# Patient Record
Sex: Female | Born: 1935 | Race: White | Hispanic: No | Marital: Single | Smoking: Never smoker
Health system: Southern US, Community
[De-identification: ages and names within clinical notes are randomized; demographics above are authoritative.]

## PROBLEM LIST (undated history)

## (undated) DIAGNOSIS — I441 Atrioventricular block, second degree: Secondary | ICD-10-CM

## (undated) DIAGNOSIS — I447 Left bundle-branch block, unspecified: Secondary | ICD-10-CM

## (undated) DIAGNOSIS — G47419 Narcolepsy without cataplexy: Secondary | ICD-10-CM

## (undated) DIAGNOSIS — C519 Malignant neoplasm of vulva, unspecified: Secondary | ICD-10-CM

## (undated) HISTORY — PX: TONSILLECTOMY: SUR1361

## (undated) HISTORY — PX: HERNIA REPAIR: SHX51

## (undated) HISTORY — PX: MASTOIDECTOMY REVISION: SUR856

## (undated) HISTORY — PX: ABDOMINAL SURGERY: SHX537

## (undated) HISTORY — PX: BOWEL RESECTION: SHX1257

## (undated) HISTORY — PX: WRIST FRACTURE SURGERY: SHX121

## (undated) HISTORY — PX: ABDOMINAL HYSTERECTOMY: SHX81

---

## 2009-09-02 ENCOUNTER — Emergency Department (HOSPITAL_COMMUNITY): Admission: EM | Admit: 2009-09-02 | Discharge: 2009-09-03 | Payer: Self-pay | Admitting: Emergency Medicine

## 2010-05-15 ENCOUNTER — Other Ambulatory Visit: Payer: Self-pay | Admitting: Family Medicine

## 2010-05-28 ENCOUNTER — Other Ambulatory Visit: Payer: Self-pay

## 2010-05-29 ENCOUNTER — Ambulatory Visit
Admission: RE | Admit: 2010-05-29 | Discharge: 2010-05-29 | Disposition: A | Discharge status: Home / Self Care | Payer: Medicare Other | Source: Ambulatory Visit | Attending: Family Medicine | Admitting: Family Medicine

## 2011-07-02 DIAGNOSIS — C519 Malignant neoplasm of vulva, unspecified: Secondary | ICD-10-CM | POA: Diagnosis not present

## 2012-01-21 DIAGNOSIS — N8111 Cystocele, midline: Secondary | ICD-10-CM | POA: Diagnosis not present

## 2012-01-21 DIAGNOSIS — C519 Malignant neoplasm of vulva, unspecified: Secondary | ICD-10-CM | POA: Diagnosis not present

## 2012-03-10 ENCOUNTER — Emergency Department (HOSPITAL_COMMUNITY): Payer: Medicare Other

## 2012-03-10 ENCOUNTER — Encounter (HOSPITAL_COMMUNITY): Payer: Self-pay | Admitting: *Deleted

## 2012-03-10 ENCOUNTER — Inpatient Hospital Stay (HOSPITAL_COMMUNITY): Payer: Medicare Other

## 2012-03-10 ENCOUNTER — Inpatient Hospital Stay (HOSPITAL_COMMUNITY)
Admission: EM | Admit: 2012-03-10 | Discharge: 2012-03-22 | DRG: 242 | Disposition: A | Discharge status: Home / Self Care | Payer: Medicare Other | Attending: Interventional Cardiology | Admitting: Interventional Cardiology

## 2012-03-10 DIAGNOSIS — D649 Anemia, unspecified: Secondary | ICD-10-CM | POA: Diagnosis present

## 2012-03-10 DIAGNOSIS — I4430 Unspecified atrioventricular block: Secondary | ICD-10-CM

## 2012-03-10 DIAGNOSIS — G47419 Narcolepsy without cataplexy: Secondary | ICD-10-CM | POA: Diagnosis present

## 2012-03-10 DIAGNOSIS — K5900 Constipation, unspecified: Secondary | ICD-10-CM | POA: Diagnosis present

## 2012-03-10 DIAGNOSIS — R42 Dizziness and giddiness: Secondary | ICD-10-CM | POA: Diagnosis not present

## 2012-03-10 DIAGNOSIS — J95811 Postprocedural pneumothorax: Secondary | ICD-10-CM | POA: Diagnosis not present

## 2012-03-10 DIAGNOSIS — J939 Pneumothorax, unspecified: Secondary | ICD-10-CM | POA: Diagnosis not present

## 2012-03-10 DIAGNOSIS — J438 Other emphysema: Secondary | ICD-10-CM | POA: Diagnosis not present

## 2012-03-10 DIAGNOSIS — G4700 Insomnia, unspecified: Secondary | ICD-10-CM | POA: Diagnosis not present

## 2012-03-10 DIAGNOSIS — Z950 Presence of cardiac pacemaker: Secondary | ICD-10-CM | POA: Diagnosis not present

## 2012-03-10 DIAGNOSIS — D72829 Elevated white blood cell count, unspecified: Secondary | ICD-10-CM | POA: Diagnosis not present

## 2012-03-10 DIAGNOSIS — Z45018 Encounter for adjustment and management of other part of cardiac pacemaker: Secondary | ICD-10-CM | POA: Diagnosis not present

## 2012-03-10 DIAGNOSIS — J449 Chronic obstructive pulmonary disease, unspecified: Secondary | ICD-10-CM | POA: Diagnosis not present

## 2012-03-10 DIAGNOSIS — J9383 Other pneumothorax: Secondary | ICD-10-CM | POA: Diagnosis not present

## 2012-03-10 DIAGNOSIS — I5033 Acute on chronic diastolic (congestive) heart failure: Secondary | ICD-10-CM | POA: Diagnosis present

## 2012-03-10 DIAGNOSIS — F4321 Adjustment disorder with depressed mood: Secondary | ICD-10-CM | POA: Diagnosis not present

## 2012-03-10 DIAGNOSIS — I441 Atrioventricular block, second degree: Secondary | ICD-10-CM | POA: Diagnosis not present

## 2012-03-10 DIAGNOSIS — J9600 Acute respiratory failure, unspecified whether with hypoxia or hypercapnia: Secondary | ICD-10-CM | POA: Diagnosis not present

## 2012-03-10 DIAGNOSIS — J90 Pleural effusion, not elsewhere classified: Secondary | ICD-10-CM | POA: Diagnosis not present

## 2012-03-10 DIAGNOSIS — J9819 Other pulmonary collapse: Secondary | ICD-10-CM | POA: Diagnosis not present

## 2012-03-10 DIAGNOSIS — E871 Hypo-osmolality and hyponatremia: Secondary | ICD-10-CM | POA: Diagnosis not present

## 2012-03-10 DIAGNOSIS — H539 Unspecified visual disturbance: Secondary | ICD-10-CM | POA: Diagnosis not present

## 2012-03-10 DIAGNOSIS — I509 Heart failure, unspecified: Secondary | ICD-10-CM | POA: Diagnosis not present

## 2012-03-10 DIAGNOSIS — J189 Pneumonia, unspecified organism: Secondary | ICD-10-CM | POA: Diagnosis not present

## 2012-03-10 DIAGNOSIS — J930 Spontaneous tension pneumothorax: Secondary | ICD-10-CM | POA: Diagnosis not present

## 2012-03-10 DIAGNOSIS — Z4682 Encounter for fitting and adjustment of non-vascular catheter: Secondary | ICD-10-CM | POA: Diagnosis not present

## 2012-03-10 DIAGNOSIS — R079 Chest pain, unspecified: Secondary | ICD-10-CM | POA: Diagnosis not present

## 2012-03-10 DIAGNOSIS — Y831 Surgical operation with implant of artificial internal device as the cause of abnormal reaction of the patient, or of later complication, without mention of misadventure at the time of the procedure: Secondary | ICD-10-CM | POA: Diagnosis not present

## 2012-03-10 DIAGNOSIS — Y9210 Unspecified residential institution as the place of occurrence of the external cause: Secondary | ICD-10-CM | POA: Diagnosis not present

## 2012-03-10 DIAGNOSIS — I459 Conduction disorder, unspecified: Secondary | ICD-10-CM | POA: Diagnosis not present

## 2012-03-10 HISTORY — DX: Left bundle-branch block, unspecified: I44.7

## 2012-03-10 HISTORY — DX: Atrioventricular block, second degree: I44.1

## 2012-03-10 HISTORY — DX: Malignant neoplasm of vulva, unspecified: C51.9

## 2012-03-10 HISTORY — DX: Narcolepsy without cataplexy: G47.419

## 2012-03-10 LAB — COMPREHENSIVE METABOLIC PANEL
ALT: 10 U/L (ref 0–35)
AST: 17 U/L (ref 0–37)
Albumin: 3.8 g/dL (ref 3.5–5.2)
Alkaline Phosphatase: 97 U/L (ref 39–117)
CO2: 26 mEq/L (ref 19–32)
Calcium: 9.3 mg/dL (ref 8.4–10.5)
Chloride: 102 mEq/L (ref 96–112)
Creatinine, Ser: 0.7 mg/dL (ref 0.50–1.10)
Creatinine, Ser: 0.71 mg/dL (ref 0.50–1.10)
GFR calc non Af Amer: 79 mL/min — ABNORMAL LOW (ref >90)
GFR calc non Af Amer: 79 mL/min — ABNORMAL LOW (ref >90)
Glucose, Bld: 90 mg/dL (ref 70–99)
Potassium: 3.9 mEq/L (ref 3.5–5.1)
Total Bilirubin: 0.3 mg/dL (ref 0.3–1.2)
Total Bilirubin: 0.3 mg/dL (ref 0.3–1.2)
Total Protein: 7 g/dL (ref 6.0–8.3)

## 2012-03-10 LAB — URINALYSIS, ROUTINE W REFLEX MICROSCOPIC
Glucose, UA: NEGATIVE mg/dL
Hgb urine dipstick: NEGATIVE
Leukocytes, UA: NEGATIVE
Nitrite: NEGATIVE
Protein, ur: NEGATIVE mg/dL
Specific Gravity, Urine: 1.006 (ref 1.005–1.030)
Urobilinogen, UA: 0.2 mg/dL (ref 0.0–1.0)

## 2012-03-10 LAB — CBC WITH DIFFERENTIAL/PLATELET
Basophils Absolute: 0 10*3/uL (ref 0.0–0.1)
Basophils Relative: 0 % (ref 0–1)
Eosinophils Absolute: 0.1 10*3/uL (ref 0.0–0.7)
Eosinophils Relative: 1 % (ref 0–5)
HCT: 38.6 % (ref 36.0–46.0)
Lymphocytes Relative: 25 % (ref 12–46)
MCH: 30.1 pg (ref 26.0–34.0)
MCV: 88.7 fL (ref 78.0–100.0)
Monocytes Relative: 8 % (ref 3–12)
Platelets: 283 10*3/uL (ref 150–400)
RBC: 4.35 MIL/uL (ref 3.87–5.11)

## 2012-03-10 LAB — PHOSPHORUS: Phosphorus: 3.1 mg/dL (ref 2.3–4.6)

## 2012-03-10 MED ORDER — ENOXAPARIN SODIUM 40 MG/0.4ML ~~LOC~~ SOLN
40.0000 mg | SUBCUTANEOUS | Status: DC
  Administered 2012-03-10: 40 mg via SUBCUTANEOUS
  Filled 2012-03-10 (×3): qty 0.4

## 2012-03-10 MED ORDER — ACETAMINOPHEN 325 MG PO TABS
650.0000 mg | ORAL_TABLET | Freq: Four times a day (QID) | ORAL | Status: DC | PRN
  Administered 2012-03-11 – 2012-03-17 (×6): 650 mg via ORAL
  Administered 2012-03-17: 325 mg via ORAL
  Administered 2012-03-18 – 2012-03-21 (×8): 650 mg via ORAL
  Administered 2012-03-22: 325 mg via ORAL
  Filled 2012-03-10: qty 2
  Filled 2012-03-10: qty 1
  Filled 2012-03-10 (×12): qty 2
  Filled 2012-03-10: qty 1
  Filled 2012-03-10: qty 2

## 2012-03-10 MED ORDER — ZOLPIDEM TARTRATE 5 MG PO TABS: 5.0000 mg | ORAL_TABLET | Freq: Every evening | ORAL | Status: DC | PRN

## 2012-03-10 MED ORDER — OXYCODONE HCL 5 MG PO TABS
5.0000 mg | ORAL_TABLET | ORAL | Status: DC | PRN
  Administered 2012-03-12: 5 mg via ORAL
  Filled 2012-03-10: qty 1

## 2012-03-10 MED ORDER — ACETAMINOPHEN 650 MG RE SUPP: 650.0000 mg | Freq: Four times a day (QID) | RECTAL | Status: DC | PRN

## 2012-03-10 MED ORDER — ASPIRIN EC 81 MG PO TBEC
81.0000 mg | DELAYED_RELEASE_TABLET | Freq: Every day | ORAL | Status: DC
  Administered 2012-03-10 – 2012-03-22 (×13): 81 mg via ORAL
  Filled 2012-03-10 (×14): qty 1

## 2012-03-10 MED ORDER — DOCUSATE SODIUM 100 MG PO CAPS
100.0000 mg | ORAL_CAPSULE | Freq: Two times a day (BID) | ORAL | Status: DC
  Administered 2012-03-10 – 2012-03-22 (×23): 100 mg via ORAL
  Filled 2012-03-10 (×26): qty 1

## 2012-03-10 NOTE — H&P (Signed)
Robin [name] is a 79 y.o. female  Admit date: 03/10/2012 Referring Physician: [facility] Primary Cardiologist:: Barry Dienes.B.Katrinka Blazing, III Chief complaint / reason for admission: Weakness  HPI: The patient came to the ER after Dr. Marlynn Perking office directed her to do so this AM. She called the office hoping to see her primary but was directed to ER after complaining of fatigue, palpitations, weakness, dizziness, and a "weird feeling " intermittently over the past 3 weeks. She denies chest pain and dyspnea. In the ER was noted to have AV block at least Mobitz type 1 and on occasion high grade AV block.  Prior history of rate related LBBB prior to Gyn surgery last year. Echocardiogram and cardiac consult at [facility] were unremarkable.    PMH:    Past Medical History  Diagnosis Date  . LBBB (left bundle branch block)   . Narcolepsy   . Vulva cancer     PSH:    Past Surgical History  Procedure Date  . Wrist fracture surgery   . Hernia repair   . Mastoidectomy revision   . Tonsillectomy   . Abdominal surgery     exploratory  . Abdominal hysterectomy   . Bowel resection     ALLERGIES:   Influenza a (h1n1) monoval pf and Penicillins  Prior to Admit Meds:   (Not in a hospital admission) Family HX:    Family History  Problem Relation Age of Onset  . Heart attack Mother   . Cancer Father    Social HX:    History   Social History  . Marital Status: Single    Spouse Name: N/A    Number of Children: N/A  . Years of Education: N/A   Occupational History  . Not on file.   Social History Main Topics  . Smoking status: Never Smoker   . Smokeless tobacco: Never Used  . Alcohol Use: Yes     Comment: seldom  . Drug Use: No  . Sexually Active: No   Other Topics Concern  . Not on file   Social History Narrative  . No narrative on file     ROS: Usually very active. No history of heart disease, seizure disorder, stroke, bleeding, DM, Htn, or other  chronic medical conditions. She denies recent upper respiratory illness, significant headache, chest pain, abdominal pain, and transient neurological symptoms.  Physical Exam: Blood pressure 188/58, pulse 49, temperature 98.5 F (36.9 C), temperature source Oral, resp. rate 19, SpO2 96.00%.   The patient is in no acute distress in her skin color is normal  The neck exam reveals no obvious JVD and carotid bruits or not heard.  Lungs reveal basilar rales.  Cardiac exam reveals an irregular rhythm. No significant murmur is heard. No rub is heard.  Abdomen soft. Bowel sounds are normal.  Upper and lower extremities reveal no edema. Radial pulses are 2+ and posterior tibial pulses are 2+.  Neurological exam is grossly intact. Labs:   Lab Results  Component Value Date   WBC 7.4 03/10/2012   HGB 13.1 03/10/2012   HCT 38.6 03/10/2012   MCV 88.7 03/10/2012   PLT 283 03/10/2012    Lab 03/10/12 1215  NA 141  K 4.3  CL 105  CO2 24  BUN 12  CREATININE 0.70  CALCIUM 9.3  PROT 7.0  BILITOT 0.3  ALKPHOS 98  ALT 12  AST 21  GLUCOSE 90     Radiology: no data  EKG:  2nd and ? 3rd degree AV block with PVC's  ASSESSMENT:  1. High grade AV block, mildly symptomatic  2. Narcolepsy  3. History of Vulvar cancer  Plan:  1. Admit for monitoring and further evaluation  2. EP consult for possible DDD pacer.  3. Check TSH and markers.  4. Echocardiogram  Lesleigh Noe 03/10/2012 4:37 PM

## 2012-03-10 NOTE — ED Notes (Signed)
I walked the patient to the bathroom and she stated it felt like she was drunk.

## 2012-03-10 NOTE — ED Provider Notes (Signed)
History     CSN: [id number]  Arrival date & time 03/10/12  1039   First MD Initiated Contact with Patient 03/10/12 1146      Chief Complaint  Patient presents with  . Dizziness    (Consider location/radiation/quality/duration/timing/severity/associated sxs/prior treatment) HPI Comments: Patient presents with a one-week history of dizziness. She did not have a sensation of spinning however she does feel off balance when she's walking. She has slight throbbing headache to her posterior scalp area. She denies any vision or speech difficulties. She denies any numbness or weakness in her extremities other than at times she feels tingling all over. She has some occasional feelings of throbbing and pulsating in her heart. This is not specifically associated with the dizziness. She denies any shortness of breath. She denies any history of heart problems in the past other than left bundle branch block. She denies any recent head trauma. She denies any fevers cough congestion or recent illnesses.   Past Medical History  Diagnosis Date  . LBBB (left bundle branch block)   . Narcolepsy   . Vulva cancer     Past Surgical History  Procedure Date  . Wrist fracture surgery   . Hernia repair   . Mastoidectomy revision   . Tonsillectomy   . Abdominal surgery     exploratory  . Abdominal hysterectomy   . Bowel resection     No family history on file.  History  Substance Use Topics  . Smoking status: Never Smoker   . Smokeless tobacco: Not on file  . Alcohol Use: Yes     Comment: seldom    OB History    Grav Para Term Preterm Abortions TAB SAB Ect Mult Living                  Review of Systems  Constitutional: Negative for fever, chills, diaphoresis and fatigue.  HENT: Negative for congestion, rhinorrhea and sneezing.   Eyes: Negative.   Respiratory: Negative for cough, chest tightness and shortness of breath.   Cardiovascular: Positive for palpitations. Negative for chest pain  and leg swelling.  Gastrointestinal: Negative for nausea, vomiting, abdominal pain, diarrhea and blood in stool.  Genitourinary: Negative for frequency, hematuria, flank pain and difficulty urinating.  Musculoskeletal: Negative for back pain and arthralgias.  Skin: Negative for rash.  Neurological: Positive for dizziness. Negative for syncope, speech difficulty, weakness, numbness and headaches.    Allergies  Influenza a (h1n1) monoval pf and Penicillins  Home Medications   Current Outpatient Rx  Name  Route  Sig  Dispense  Refill  . DOCUSATE SODIUM 100 MG PO CAPS   Oral   Take 100 mg by mouth daily as needed. constipation         . ECHINACEA 125 MG PO CAPS   Oral   Take 1 capsule by mouth daily.           BP 188/58  Pulse 49  Temp 98.5 F (36.9 C) (Oral)  Resp 19  SpO2 96%  Physical Exam  Constitutional: She is oriented to person, place, and time. She appears well-developed and well-nourished.  HENT:  Head: Normocephalic and atraumatic.  Eyes: Pupils are equal, round, and reactive to light.       No nystagmus  Neck: Normal range of motion. Neck supple.  Cardiovascular: Normal rate, regular rhythm and normal heart sounds.   Pulmonary/Chest: Effort normal and breath sounds normal. No respiratory distress. She has no wheezes. She has no rales. She  exhibits no tenderness.  Abdominal: Soft. Bowel sounds are normal. There is no tenderness. There is no rebound and no guarding.  Musculoskeletal: Normal range of motion. She exhibits no edema.  Lymphadenopathy:    She has no cervical adenopathy.  Neurological: She is alert and oriented to person, place, and time. She has normal strength. No cranial nerve deficit or sensory deficit. GCS eye subscore is 4. GCS verbal subscore is 5. GCS motor subscore is 6.       Finger to nose intact  Skin: Skin is warm and dry. No rash noted.  Psychiatric: She has a normal mood and affect.    ED Course  Procedures (including critical  care time)  Results for orders placed during the hospital encounter of 03/10/12  CBC WITH DIFFERENTIAL      Component Value Range   WBC 7.4  4.0 - 10.5 K/uL   RBC 4.35  3.87 - 5.11 MIL/uL   Hemoglobin 13.1  12.0 - 15.0 g/dL   HCT 62.1  30.8 - 65.7 %   MCV 88.7  78.0 - 100.0 fL   MCH 30.1  26.0 - 34.0 pg   MCHC 33.9  30.0 - 36.0 g/dL   RDW 84.6  96.2 - 95.2 %   Platelets 283  150 - 400 K/uL   Neutrophils Relative 66  43 - 77 %   Neutro Abs 4.9  1.7 - 7.7 K/uL   Lymphocytes Relative 25  12 - 46 %   Lymphs Abs 1.9  0.7 - 4.0 K/uL   Monocytes Relative 8  3 - 12 %   Monocytes Absolute 0.6  0.1 - 1.0 K/uL   Eosinophils Relative 1  0 - 5 %   Eosinophils Absolute 0.1  0.0 - 0.7 K/uL   Basophils Relative 0  0 - 1 %   Basophils Absolute 0.0  0.0 - 0.1 K/uL  COMPREHENSIVE METABOLIC PANEL      Component Value Range   Sodium 141  135 - 145 mEq/L   Potassium 4.3  3.5 - 5.1 mEq/L   Chloride 105  96 - 112 mEq/L   CO2 24  19 - 32 mEq/L   Glucose, Bld 90  70 - 99 mg/dL   BUN 12  6 - 23 mg/dL   Creatinine, Ser 8.41  0.50 - 1.10 mg/dL   Calcium 9.3  8.4 - 32.4 mg/dL   Total Protein 7.0  6.0 - 8.3 g/dL   Albumin 3.9  3.5 - 5.2 g/dL   AST 21  0 - 37 U/L   ALT 12  0 - 35 U/L   Alkaline Phosphatase 98  39 - 117 U/L   Total Bilirubin 0.3  0.3 - 1.2 mg/dL   GFR calc non Af Amer 79 (*) >90 mL/min   GFR calc Af Amer >90  >90 mL/min  URINALYSIS, ROUTINE W REFLEX MICROSCOPIC      Component Value Range   Color, Urine YELLOW  YELLOW   APPearance CLEAR  CLEAR   Specific Gravity, Urine 1.006  1.005 - 1.030   pH 7.0  5.0 - 8.0   Glucose, UA NEGATIVE  NEGATIVE mg/dL   Hgb urine dipstick NEGATIVE  NEGATIVE   Bilirubin Urine NEGATIVE  NEGATIVE   Ketones, ur NEGATIVE  NEGATIVE mg/dL   Protein, ur NEGATIVE  NEGATIVE mg/dL   Urobilinogen, UA 0.2  0.0 - 1.0 mg/dL   Nitrite NEGATIVE  NEGATIVE   Leukocytes, UA NEGATIVE  NEGATIVE   Mr Brain 62  Contrast  03/10/2012  *RADIOLOGY REPORT*  Clinical Data:  Dizziness.  Rule out stroke  MRI HEAD WITHOUT CONTRAST  Technique:  Multiplanar, multiecho pulse sequences of the brain and surrounding structures were obtained according to standard protocol without intravenous contrast.  Comparison: None  Findings: Negative for acute infarct.  Mild chronic microvascular ischemic change in the cerebral white matter and pons.  Cerebellum is intact.  Negative for intracranial hemorrhage.  No mass or edema.  Paranasal sinuses are clear.  IMPRESSION: No acute abnormality.  Mild chronic microvascular ischemia.   Original Report Authenticated By: Janeece Riggers, M.D.       Date: 03/10/2012  Rate: 71  Rhythm: normal sinus rhythm and premature ventricular contractions (PVC)  QRS Axis: left  Intervals: normal  ST/T Wave abnormalities: nonspecific ST/T changes  Conduction Disutrbances:right bundle branch block and left anterior fascicular block  Narrative Interpretation:   Old EKG Reviewed: none available   Date: 03/10/2012  Rate: 72  Rhythm: Mobitz type 2 block with bradycardia, bigimeny  QRS Axis: left  Intervals: normal  ST/T Wave abnormalities: nonspecific ST/T changes  Conduction Disutrbances:left anterior fascicular block, RBBB  Narrative Interpretation:   Old EKG Reviewed: changes noted    1. Dizziness   2. Mobitz type 2 second degree heart block       MDM  Repeat EKG shows 2nd degree type II heart block with bigeminy.  No signs of stroke.  Pt is having intermittent bradycardia down to 30s, but is maintaining BP.  I consulted cardiology.  Dr Katrinka Blazing is coming to see pt.        Rolan Bucco, MD 03/10/12 [phone]

## 2012-03-10 NOTE — ED Notes (Signed)
[facility] called for transport. 

## 2012-03-10 NOTE — ED Notes (Signed)
Pt reports "feeling weird almost like drunk" for over a week. Pt sts called [facility] office this am (last time she was seen by dr was 2 years ago) and was told to go to ed immediately "because like my daughter they thought I am having a stroke). Pt is very vague with describing her symptoms and has multiple complaints. Neuro check was negative, pt denies pain.

## 2012-03-10 NOTE — ED Notes (Signed)
Got the patient into a gown and placed her belongings into a patient bag. She is sitting with her neighbor.

## 2012-03-10 NOTE — ED Notes (Signed)
Pt noted on monitor to be brady; heart rate low as 39 few times. Dr Fredderick Phenix notified. Pt is asymptomatic, b/p 185/54. Will continue to monitor.

## 2012-03-10 NOTE — ED Notes (Signed)
[id number]> Expected date:<BR> Expected time:<BR> Means of arrival:<BR> Comments:<BR> Hold for [facility]

## 2012-03-10 NOTE — ED Notes (Signed)
Pt reports multiple complaints. Has list written down of complaints, "feel heart beating in fingers/ toes, dizzy, head feels weird, shaky, equilibrium off balance, pain in back and neck, pressure in temples, feels like I'm drunk, lips feel puffy." No swelling noted to lips, no slurred speech, stroke screen neg.

## 2012-03-11 ENCOUNTER — Inpatient Hospital Stay (HOSPITAL_COMMUNITY): Payer: Medicare Other

## 2012-03-11 ENCOUNTER — Encounter (HOSPITAL_COMMUNITY): Payer: Self-pay | Admitting: Internal Medicine

## 2012-03-11 ENCOUNTER — Encounter (HOSPITAL_COMMUNITY)
Admission: EM | Disposition: A | Discharge status: Home / Self Care | Payer: Self-pay | Source: Home / Self Care | Attending: Interventional Cardiology

## 2012-03-11 DIAGNOSIS — I4430 Unspecified atrioventricular block: Secondary | ICD-10-CM

## 2012-03-11 DIAGNOSIS — Z950 Presence of cardiac pacemaker: Secondary | ICD-10-CM | POA: Diagnosis not present

## 2012-03-11 DIAGNOSIS — I5033 Acute on chronic diastolic (congestive) heart failure: Secondary | ICD-10-CM | POA: Diagnosis present

## 2012-03-11 DIAGNOSIS — J939 Pneumothorax, unspecified: Secondary | ICD-10-CM | POA: Diagnosis not present

## 2012-03-11 DIAGNOSIS — J9383 Other pneumothorax: Secondary | ICD-10-CM

## 2012-03-11 DIAGNOSIS — I441 Atrioventricular block, second degree: Principal | ICD-10-CM

## 2012-03-11 HISTORY — PX: PERMANENT PACEMAKER INSERTION: SHX5480

## 2012-03-11 LAB — POCT I-STAT 3, ART BLOOD GAS (G3+)
O2 Saturation: 93 %
Patient temperature: 98.6
TCO2: 25 mmol/L (ref 0–100)
pCO2 arterial: 41.2 mmHg (ref 35.0–45.0)

## 2012-03-11 LAB — PROTIME-INR: INR: 1.02 (ref 0.00–1.49)

## 2012-03-11 SURGERY — PERMANENT PACEMAKER INSERTION
Anesthesia: LOCAL

## 2012-03-11 MED ORDER — HEPARIN (PORCINE) IN NACL 2-0.9 UNIT/ML-% IJ SOLN
INTRAMUSCULAR | Status: AC
  Filled 2012-03-11: qty 500

## 2012-03-11 MED ORDER — GENTAMICIN SULFATE 40 MG/ML IJ SOLN
80.0000 mg | INTRAMUSCULAR | Status: AC
  Administered 2012-03-11: 80 mg
  Filled 2012-03-11 (×2): qty 2

## 2012-03-11 MED ORDER — OFF THE BEAT BOOK
Freq: Once | Status: AC
  Administered 2012-03-11: 14:00:00
  Filled 2012-03-11: qty 1

## 2012-03-11 MED ORDER — SODIUM CHLORIDE 0.45 % IV SOLN
INTRAVENOUS | Status: DC
  Administered 2012-03-11: 13:00:00 via INTRAVENOUS

## 2012-03-11 MED ORDER — VANCOMYCIN HCL IN DEXTROSE 1-5 GM/200ML-% IV SOLN
1000.0000 mg | INTRAVENOUS | Status: AC
  Administered 2012-03-11: 1000 mg via INTRAVENOUS
  Filled 2012-03-11: qty 200

## 2012-03-11 MED ORDER — SODIUM CHLORIDE 0.9 % IV SOLN
250.0000 mL | INTRAVENOUS | Status: DC
  Administered 2012-03-11: 13:00:00 via INTRAVENOUS

## 2012-03-11 MED ORDER — MIDAZOLAM HCL 5 MG/5ML IJ SOLN
INTRAMUSCULAR | Status: AC
  Filled 2012-03-11: qty 5

## 2012-03-11 MED ORDER — ONDANSETRON HCL 4 MG/2ML IJ SOLN
4.0000 mg | Freq: Four times a day (QID) | INTRAMUSCULAR | Status: DC | PRN
  Administered 2012-03-11: 4 mg via INTRAVENOUS
  Filled 2012-03-11 (×2): qty 2

## 2012-03-11 MED ORDER — OXYCODONE-ACETAMINOPHEN 5-325 MG PO TABS
1.0000 | ORAL_TABLET | ORAL | Status: DC | PRN
  Administered 2012-03-11 – 2012-03-13 (×5): 1 via ORAL
  Filled 2012-03-11 (×5): qty 1

## 2012-03-11 MED ORDER — MIDAZOLAM HCL 5 MG/5ML IJ SOLN
INTRAMUSCULAR | Status: AC
  Filled 2012-03-11: qty 10

## 2012-03-11 MED ORDER — CHLORHEXIDINE GLUCONATE 4 % EX LIQD
60.0000 mL | Freq: Once | CUTANEOUS | Status: AC
  Administered 2012-03-11: 4 via TOPICAL
  Filled 2012-03-11: qty 15

## 2012-03-11 MED ORDER — LIDOCAINE HCL (PF) 1 % IJ SOLN
INTRAMUSCULAR | Status: AC
  Filled 2012-03-11: qty 30

## 2012-03-11 MED ORDER — MIDAZOLAM HCL 2 MG/2ML IJ SOLN
INTRAMUSCULAR | Status: AC
  Filled 2012-03-11: qty 2

## 2012-03-11 MED ORDER — FENTANYL CITRATE 0.05 MG/ML IJ SOLN
INTRAMUSCULAR | Status: AC
  Filled 2012-03-11: qty 2

## 2012-03-11 MED ORDER — ACETAMINOPHEN 325 MG PO TABS: 325.0000 mg | ORAL_TABLET | ORAL | Status: DC | PRN

## 2012-03-11 MED ORDER — FLUMAZENIL 0.5 MG/5ML IV SOLN: 0.2000 mg | Freq: Once | INTRAVENOUS | Status: DC

## 2012-03-11 MED ORDER — FENTANYL CITRATE 0.05 MG/ML IJ SOLN
12.5000 ug | INTRAMUSCULAR | Status: DC | PRN
  Administered 2012-03-11 – 2012-03-15 (×8): 12.5 ug via INTRAVENOUS
  Filled 2012-03-11 (×9): qty 2

## 2012-03-11 MED ORDER — FLUMAZENIL 0.5 MG/5ML IV SOLN
INTRAVENOUS | Status: AC
  Filled 2012-03-11: qty 5

## 2012-03-11 MED ORDER — FENTANYL CITRATE 0.05 MG/ML IJ SOLN
INTRAMUSCULAR | Status: AC
  Filled 2012-03-11: qty 4

## 2012-03-11 MED ORDER — SODIUM CHLORIDE 0.9 % IR SOLN
Status: DC
  Filled 2012-03-11: qty 2

## 2012-03-11 MED ORDER — SODIUM CHLORIDE 0.9 % IJ SOLN: 3.0000 mL | INTRAMUSCULAR | Status: DC | PRN

## 2012-03-11 MED ORDER — CHLORHEXIDINE GLUCONATE 4 % EX LIQD: 60.0000 mL | Freq: Once | CUTANEOUS | Status: DC

## 2012-03-11 MED ORDER — SODIUM CHLORIDE 0.9 % IV SOLN
INTRAVENOUS | Status: AC
  Administered 2012-03-11: 19:00:00 via INTRAVENOUS

## 2012-03-11 MED ORDER — LIDOCAINE HCL (PF) 1 % IJ SOLN
INTRAMUSCULAR | Status: AC
  Filled 2012-03-11: qty 60

## 2012-03-11 MED ORDER — FENTANYL CITRATE 0.05 MG/ML IJ SOLN
INTRAMUSCULAR | Status: AC
  Administered 2012-03-11: 12.5 ug via INTRAVENOUS
  Filled 2012-03-11: qty 2

## 2012-03-11 MED ORDER — VANCOMYCIN HCL IN DEXTROSE 1-5 GM/200ML-% IV SOLN
1000.0000 mg | Freq: Two times a day (BID) | INTRAVENOUS | Status: AC
  Administered 2012-03-12: 1000 mg via INTRAVENOUS
  Filled 2012-03-11: qty 200

## 2012-03-11 MED ORDER — SODIUM CHLORIDE 0.9 % IJ SOLN
3.0000 mL | Freq: Two times a day (BID) | INTRAMUSCULAR | Status: DC
  Administered 2012-03-11: 3 mL via INTRAVENOUS

## 2012-03-11 NOTE — Consult Note (Signed)
ELECTROPHYSIOLOGY CONSULT NOTE    Patient ID: Robin [name] MRN: [id number], DOB/AGE: Jan 20, 1929 79 y.o.  Admit date: 03/10/2012 Date of Consult: 03-12-2011  Primary Physician: Sheryle Hail) Primary Cardiologist: Garnette Scheuermann, MD  Reason for Consultation: 2:1 heart block  HPI:  Robin [name] is an 79 year old female whom we have been asked to see for heart block.  Her past medical history is significant for arthritis, hernia repair, and vulva cancer (s/p surgery in December of last year).  She takes no home medications aside from Echinacea and Aleve prn. Cardiac workup done at [facility] prior to surgery last year was reportedly unremarkable and included an echocardiogram.  There is also reports of LBBB in the past.   Over the last week, she has noticed "something was not quite right".  She reports fatigue, dizziness, weakness.  She reports falling occasionally, but denies frank syncope.  She was evaluated at [facility] ER yesterday and found to have AV block with RBBB.  She was then transferred to [facility] for further evaluation.  Lab data has been unremarkable.  TSH and echo are still pending.   Robin [name] lives independently and enjoys gardening.  She is a retired Engineer, civil (consulting).  She moved to [place] from [place] to be closer to 3 of her 4 children who live here.  Prior to last year, she only rarely visited the doctor.    She has allergies to the flu shot and also PCN.   ROS is negative except as outlined above.    Past Medical History  Diagnosis Date  . LBBB (left bundle branch block)   . Narcolepsy   . Vulva cancer      Surgical History:  Past Surgical History  Procedure Date  . Wrist fracture surgery   . Hernia repair   . Mastoidectomy revision   . Tonsillectomy   . Abdominal surgery     exploratory  . Abdominal hysterectomy   . Bowel resection      Prescriptions prior to admission  Medication Sig Dispense Refill  . docusate sodium (COLACE) 100 MG capsule Take 100 mg by mouth  daily as needed. constipation      . Echinacea 125 MG CAPS Take 1 capsule by mouth daily.        Inpatient Medications:     . aspirin EC  81 mg Oral Daily  . docusate sodium  100 mg Oral BID  . enoxaparin (LOVENOX) injection  40 mg Subcutaneous Q24H  . off the beat book   Does not apply Once    Allergies:  Allergies  Allergen Reactions  . Influenza A (H1n1) Monoval Pf   . Penicillins Hives and Rash    History   Social History  . Marital Status: Single    Spouse Name: N/A    Number of Children: N/A  . Years of Education: N/A   Occupational History  . Retired Engineer, civil (consulting)   Social History Main Topics  . Smoking status: Never Smoker   . Smokeless tobacco: Never Used  . Alcohol Use: Yes     Comment: seldom  . Drug Use: No  . Sexually Active: No    Family History  Problem Relation Age of Onset  . Heart attack Mother   . Cancer Father      BP 168/52  Pulse 39  Temp 98 F (36.7 C) (Oral)  Resp 14  Ht 4\' 11"  (1.499 m)  Wt 136 lb 11 oz (62 kg)  BMI 27.61 kg/m2  SpO2 91% Alert and oriented in no acute distress HENT- normal Eyes- EOMI, without scleral icterus Skin- warm and dry; without rashes LN-neg Neck- supple without thyromegaly, JVP-flat, carotids brisk and full without bruits Back-without CVAT or kyphosis Lungs-clear to auscultation CV-Slow but irregular rate and rhythm, nl S1 and S2, 2/6 murmur  intermittent Abd-soft with active bowel sounds; no midline pulsation or hepatomegaly Pulses-intact femoral and distal MKS-without gross deformity Neuro- Ax O, CN3-12 intact, grossly normal motor and sensory function Affect engaging    Labs:   Lab Results  Component Value Date   WBC 7.4 03/10/2012   HGB 13.1 03/10/2012   HCT 38.6 03/10/2012   MCV 88.7 03/10/2012   PLT 283 03/10/2012     Lab 03/10/12 1655  NA 140  K 3.9  CL 102  CO2 26  BUN 9  CREATININE 0.71  CALCIUM 9.5  PROT 7.1  BILITOT 0.3  ALKPHOS 97  ALT 10  AST 17  GLUCOSE 107*   Lab  Results  Component Value Date   TROPONINI <0.30 03/10/2012    Radiology/Studies: Dg Chest 2 View 03/10/2012  *RADIOLOGY REPORT*  Clinical Data: Heart block.  Dizziness and weakness.  CHEST - 2 VIEW  Comparison: None.  Findings: Two views of the chest were obtained.  There is a right paratracheal density that could be related to thyroid or vascular structures.  The lungs are clear without airspace disease or pulmonary edema.  Patchy densities overlying the left side of the heart could be related to overlying ribs.  Heart size is within normal limits for size.  Trachea is midline.  IMPRESSION: No acute chest findings.   Original Report Authenticated By: Richarda Overlie, M.D.    Mr Brain Wo Contrast 03/10/2012  *RADIOLOGY REPORT*  Clinical Data: Dizziness.  Rule out stroke  MRI HEAD WITHOUT CONTRAST  Technique:  Multiplanar, multiecho pulse sequences of the brain and surrounding structures were obtained according to standard protocol without intravenous contrast.  Comparison: None  Findings: Negative for acute infarct.  Mild chronic microvascular ischemic change in the cerebral white matter and pons.  Cerebellum is intact.  Negative for intracranial hemorrhage.  No mass or edema.  Paranasal sinuses are clear.  IMPRESSION: No acute abnormality.  Mild chronic microvascular ischemia.   Original Report Authenticated By: Janeece Riggers, M.D.    EKG: Mobitz II with RBBB  TELEMETRY: Mobitz II, occasional PVC's, v rates 30-40   Patient Active Hospital Problem List: AV heart block (03/10/2012)   Acute on chronic diastolic heart failure (03/11/2012)   Pt with symptomatic heart block, 2:1 with RBBB.  Previously normal with rate related LBBB.  The other issue is modest cardiomyopathy.  My inclincation is if EF is 40% or so, reasonable to place LV lead at the time of implant because of risk of deterioratin LV function with RV apical pacing The benefits and risks were reviewed including but not limited to death,  perforation,  infection, lead dislodgement and device malfunction.  The patient understands agrees and is willing to proceed. If she is device dependent she will be monitored in the ICU post implant Await echo and TSH

## 2012-03-11 NOTE — Progress Notes (Signed)
O2 Sats down to 88% on 4L/M [place].  Patient's Respiration 36 and shallow.  Placed on 50% V Mask. 2110: Radiology phone XRAY report.  Dr. Brooks Sailors notified.  Kink in CT found near entrance of chest tube site-area corrected-placed on 100% NRB,  RR decreased, HR decreased.  O2 sats increased.  Continue to monitor.

## 2012-03-11 NOTE — Progress Notes (Signed)
Pt with pleurisy post implant with prog SOB  CXR>> B pneumothoracies.  CCM called  Spoke with family

## 2012-03-11 NOTE — Plan of Care (Signed)
Problem: Phase I Progression Outcomes Goal: Ventricular heart rate < 120/min Outcome: Completed/Met Date Met:  03/10/12 Atrial arrhythmia is 2nd degree AV block, Type II with symptomatic bradycardia. Patient education related to these problems has been initiated.

## 2012-03-11 NOTE — Procedures (Signed)
Right Chest Tube Insertion Procedure Note  Indications:  Clinically significant Pneumothorax  Pre-operative Diagnosis: Pneumothorax  Post-operative Diagnosis: Pneumothorax  Procedure Details  Informed consent was obtained for the procedure, including sedation.  Risks of lung perforation, hemorrhage, arrhythmia, and adverse drug reaction were discussed.   After sterile skin prep, using standard technique, a 20 French tube was placed in the right lateral 8th rib space.  Findings: None  Estimated Blood Loss:  Minimal         Specimens:  None              Complications:  None; patient tolerated the procedure well.         Disposition: ICU - extubated and stable.         Condition: stable  Attending Attestation: I performed the procedure.  Billy Fischer, MD ; [facility] [phone].  After 5:30 PM or weekends, call [phone]

## 2012-03-11 NOTE — Progress Notes (Addendum)
Patient Name: Robin [name] Date of Encounter: 03/11/2012    SUBJECTIVE:  Quiet night for the patient. She intermittently feels weak and lightheaded.  TELEMETRY:  Second-degree heart block. Mobitz 1 versus Mobitz 2. Filed Vitals:   03/10/12 1824 03/10/12 1830 03/10/12 2039 03/11/12 0446  BP: 214/76 190/72 112/85 168/52  Pulse: 36  37 39  Temp: 98 F (36.7 C)  98.2 F (36.8 C) 98 F (36.7 C)  TempSrc:   Oral Oral  Resp: 18  15 14   Height: 4\' 11"  (1.499 m)     Weight: 62.052 kg (136 lb 12.8 oz)   62 kg (136 lb 11 oz)  SpO2: 99%  95% 91%   No intake or output data in the 24 hours ending 03/11/12 1001  LABS: Basic Metabolic Panel:  Basename 03/10/12 1655 03/10/12 1215  NA 140 141  K 3.9 4.3  CL 102 105  CO2 26 24  GLUCOSE 107* 90  BUN 9 12  CREATININE 0.71 0.70  CALCIUM 9.5 9.3  MG 2.3 --  PHOS 3.1 --   CBC:  Basename 03/10/12 1215  WBC 7.4  NEUTROABS 4.9  HGB 13.1  HCT 38.6  MCV 88.7  PLT 283   Cardiac Enzymes:  Basename 03/10/12 1655  CKTOTAL --  CKMB --  CKMBINDEX --  TROPONINI <0.30   BNP No results found for this basename: probnp   Radiology/Studies:  No acute abnormality noted  ECHOCARDIOGRAM: ------------------------- INTERPRETATION -------------------------  Technically difficult study Normal left ventricular size Mildly reduced left ventricular systolic function Mild left ventricular global hypokinesis. LVEF 45-50% Abnormal diastolic relaxation Normal right ventricular size Normal right ventricular function Mild tricuspid regurgitation Mild pulmonary regurgitation No pericardial effusion No old study for comparison  I have personally reviewed and interpreted this echocardiogram.  Signed: 09/08/2010 05:38 PM By: Bennetta Laos, MD   Physical Exam: Blood pressure 168/52, pulse 39, temperature 98 F (36.7 C), temperature source Oral, resp. rate 14, height 4\' 11"  (1.499 m), weight 62 kg (136 lb 11 oz), SpO2  91.00%. Weight change:    1 over 6 systolic murmur. No diastolic murmur.  Clear lung fields  ASSESSMENT:  1. Second-degree AV block, symptomatic  2. Suspect diastolic heart failure, acute related to bradycardia. Prior echocardiogram at [facility] in 2012 revealed low normal LV function of 45 %.  3. History of vulvar cancer  4. Hypertension, may need medical therapy.  Plan:  1. N.p.o. for DDD pacemaker later today by Dr. Graciela Husbands  2. Add antihypertensive therapy after pacemaker successfully placed, I prefer amlodipine.  3. Possible discharge in a.m. if all goes well. Office appointment already made for February 4 at [facility] cardiology  Signed, Lesleigh Noe 03/11/2012, 10:01 AM

## 2012-03-11 NOTE — CV Procedure (Signed)
Robin [name] [id number]  [id number]  Preop Dx:2:1  Postop Dx same/ iatrogenic occlusion of L [place] Vein  Procedure: venogram R and L Dual chamber pacer insertion  Cx: None inability ot cannulate L SCvein, aspiration of air x2 on L  Dictation number [id number]  Sherryl Manges, MD 03/11/2012 3:58 PM

## 2012-03-11 NOTE — Progress Notes (Signed)
Utilization review completed.  

## 2012-03-11 NOTE — Consult Note (Signed)
PULMONARY  / CRITICAL CARE MEDICINE  Name: Robin [name] MRN: [id number] DOB: 10-Aug-1928    LOS: 1  REFERRING PROVIDER:  Dr. Graciela Husbands  CHIEF COMPLAINT:  Bilateral Pneumothorax  BRIEF PATIENT DESCRIPTION: 79 y/o F with PMH of Narcolepsy, LBBB -rate related,  Mobitz 2 AV block with RBBB admitted on 1/24 for dual chamber pacemaker insertion.  Both sides attempted -post procedure, patient was noted to have mild chest pain. CXR demonstrated bilateral pneumothorax.    LINES / TUBES: 1/24 R CT>>> 1/24 L CT>>>   SIGNIFICANT EVENTS:  1/24 - Admit for dual chamber pacer, bilateral PTX requiring CT x2  HISTORY OF PRESENT ILLNESS:  79 y/o F with PMH of Narcolepsy, LBBB -rate related,  Mobitz 2 AV block with RBBB admitted on 1/24 for dual chamber pacemaker insertion.  Both sides attempted -post procedure, patient was noted to have mild chest pain. CXR demonstrated bilateral pneumothorax.    PAST MEDICAL HISTORY :  Past Medical History  Diagnosis Date  . LBBB (left bundle branch block)-rate related   . Narcolepsy   . Vulva cancer   . Mobitz type 2 second degree AV block     2:1  with RBBB   Past Surgical History  Procedure Date  . Wrist fracture surgery   . Hernia repair   . Mastoidectomy revision   . Tonsillectomy   . Abdominal surgery     exploratory  . Abdominal hysterectomy   . Bowel resection    Prior to Admission medications   Medication Sig Start Date End Date Taking? Authorizing Provider  docusate sodium (COLACE) 100 MG capsule Take 100 mg by mouth daily as needed. constipation   Yes Historical Provider, MD  Echinacea 125 MG CAPS Take 1 capsule by mouth daily.   Yes Historical Provider, MD   Allergies  Allergen Reactions  . Influenza A (H1n1) Monoval Pf   . Penicillins Hives and Rash    FAMILY HISTORY:  Family History  Problem Relation Age of Onset  . Heart attack Mother   . Cancer Father    SOCIAL HISTORY:  reports that she has never smoked. She has  never used smokeless tobacco. She reports that she drinks alcohol. She reports that she does not use illicit drugs.  REVIEW OF SYSTEMS:  Unable to complete as pt sedated for CT placement  INTERVAL HISTORY:  Sedate post CT placement  VITAL SIGNS: Temp:  [98 F (36.7 C)-98.3 F (36.8 C)] 98.3 F (36.8 C) (01/24 1315) Pulse Rate:  [36-45] 45  (01/24 1315) Resp:  [14-18] 16  (01/24 1315) BP: (112-214)/(52-85) 181/63 mmHg (01/24 1315) SpO2:  [91 %-99 %] 96 % (01/24 1315) Weight:  [136 lb 11 oz (62 kg)-136 lb 12.8 oz (62.052 kg)] 136 lb 11 oz (62 kg) (01/24 0446)  PHYSICAL EXAMINATION: General:  Elderly female s/p bilateral CT placement, sedate Neuro:  AAOx4 pre-procedure, MAE HEENT:  Mm pink/moist, no jvd Cardiovascular:  s1s2 rrr, no m/r/g Lungs:  resp's even/non-labored, lungs bilaterally diminished Abdomen:  Round/soft, bsx4 active Musculoskeletal: no acute deform Skin:  Warm/dry, no edema   Lab 03/10/12 1655 03/10/12 1215  NA 140 141  K 3.9 4.3  CL 102 105  CO2 26 24  BUN 9 12  CREATININE 0.71 0.70  GLUCOSE 107* 90    Lab 03/10/12 1215  HGB 13.1  HCT 38.6  WBC 7.4  PLT 283   Dg Chest 2 View  03/10/2012  *RADIOLOGY REPORT*  Clinical Data: Heart block.  Dizziness and  weakness.  CHEST - 2 VIEW  Comparison: None.  Findings: Two views of the chest were obtained.  There is a right paratracheal density that could be related to thyroid or vascular structures.  The lungs are clear without airspace disease or pulmonary edema.  Patchy densities overlying the left side of the heart could be related to overlying ribs.  Heart size is within normal limits for size.  Trachea is midline.  IMPRESSION: No acute chest findings.   Original Report Authenticated By: Richarda Overlie, M.D.    Mr Brain Wo Contrast  03/10/2012  *RADIOLOGY REPORT*  Clinical Data: Dizziness.  Rule out stroke  MRI HEAD WITHOUT CONTRAST  Technique:  Multiplanar, multiecho pulse sequences of the brain and surrounding  structures were obtained according to standard protocol without intravenous contrast.  Comparison: None  Findings: Negative for acute infarct.  Mild chronic microvascular ischemic change in the cerebral white matter and pons.  Cerebellum is intact.  Negative for intracranial hemorrhage.  No mass or edema.  Paranasal sinuses are clear.  IMPRESSION: No acute abnormality.  Mild chronic microvascular ischemia.   Original Report Authenticated By: Janeece Riggers, M.D.    Dg Chest Port 1 View  03/11/2012  *RADIOLOGY REPORT*  Clinical Data: Chest pain.  Pacemaker insertion.  PORTABLE CHEST - 1 VIEW  Comparison: Chest x-ray 80 03/10/2012.  Findings: The permanent right-sided pacemaker and right atrial and particular wires are in good position. There are bilateral 25% pneumothoraces.  Mild compressive lower lobe atelectasis and underlying emphysematous changes.  IMPRESSION:  1.  Pacer wires in good position. 2.  Bilateral 25% pneumothoraces.  Findings called directly to the cath lab.   Original Report Authenticated By: Rudie Meyer, M.D.     ASSESSMENT / PLAN:  Bilateral Pneumothorax Mild LLL ATX  79 y/o F admitted for dual chamber pacemaker placement.  Bilateral sides attempted for pacemaker placement.  Post procedure CXR demonstrated bilateral 25% PTX.  Bilateral CT's placed in Cath lab holding area.  ATX noted pre-chest tube placement.    Plan: -CT placement bilaterally with f/u CXR -am CXR  -CT's to 20 cm suction -O2 to support sats >93% -hold abx, monitor fever curve & leukocytosis with LLL atx   Canary Brim, NP-C Pulmonary and Critical Care Medicine [facility] Pager: [phone]  03/11/2012, 5:42 PM  I have interviewed and examined the patient and reviewed the database. I have formulated the assessment and plan as reflected in the note above with amendments made by me.   Billy Fischer, MD;  PCCM service; Mobile [phone]

## 2012-03-11 NOTE — Procedures (Addendum)
Left Chest Tube Insertion Procedure Note  Indications:  Clinically significant Pneumothorax  Pre-operative Diagnosis: Pneumothorax  Post-operative Diagnosis: Pneumothorax  Procedure Details  Informed consent was obtained for the procedure, including sedation.  Risks of lung perforation, hemorrhage, arrhythmia, and adverse drug reaction were discussed.   After sterile skin prep, using standard technique, a 20 French tube was placed in the left lateral 5th rib space.  Findings: None  Estimated Blood Loss:  Minimal         Specimens:  None              Complications:  None; patient tolerated the procedure well.         Disposition: ICU - hemodynamically stable         Condition: stable  Attending Attestation: I was present and scrubbed for the entire procedure.   Chest tube placed under direct supervision of Dr. Sung Amabile.  Canary Brim, NP-C [facility] Pgr: [phone] or [phone]   I was present for and supervised the entire procedure  Billy Fischer, MD ; [facility] service Mobile [phone].  After 5:30 PM or weekends, call [phone]

## 2012-03-11 NOTE — Progress Notes (Signed)
  Echocardiogram 2D Echocardiogram has been performed.  Rashema Seawright FRANCES 03/11/2012, 11:02 AM

## 2012-03-12 ENCOUNTER — Inpatient Hospital Stay (HOSPITAL_COMMUNITY): Payer: Medicare Other

## 2012-03-12 DIAGNOSIS — J90 Pleural effusion, not elsewhere classified: Secondary | ICD-10-CM

## 2012-03-12 DIAGNOSIS — J449 Chronic obstructive pulmonary disease, unspecified: Secondary | ICD-10-CM

## 2012-03-12 DIAGNOSIS — J189 Pneumonia, unspecified organism: Secondary | ICD-10-CM

## 2012-03-12 LAB — BASIC METABOLIC PANEL
BUN: 15 mg/dL (ref 6–23)
Calcium: 8.8 mg/dL (ref 8.4–10.5)
Creatinine, Ser: 0.79 mg/dL (ref 0.50–1.10)
GFR calc Af Amer: 88 mL/min — ABNORMAL LOW (ref >90)
GFR calc non Af Amer: 76 mL/min — ABNORMAL LOW (ref >90)
Glucose, Bld: 111 mg/dL — ABNORMAL HIGH (ref 70–99)

## 2012-03-12 LAB — CBC
Hemoglobin: 11.5 g/dL — ABNORMAL LOW (ref 12.0–15.0)
MCH: 31.1 pg (ref 26.0–34.0)
MCHC: 34.7 g/dL (ref 30.0–36.0)
Platelets: 231 10*3/uL (ref 150–400)
RDW: 13.1 % (ref 11.5–15.5)

## 2012-03-12 MED ORDER — BOOST / RESOURCE BREEZE PO LIQD
1.0000 | Freq: Three times a day (TID) | ORAL | Status: DC
  Administered 2012-03-12 – 2012-03-21 (×18): 1 via ORAL

## 2012-03-12 MED ORDER — SODIUM CHLORIDE 0.9 % IV SOLN
INTRAVENOUS | Status: DC
  Administered 2012-03-12: via INTRAVENOUS

## 2012-03-12 MED ORDER — LIDOCAINE HCL (PF) 1 % IJ SOLN
INTRAMUSCULAR | Status: AC
  Filled 2012-03-12: qty 5

## 2012-03-12 NOTE — Progress Notes (Signed)
1 Day Post-Op Procedure(s) (LRB): PERMANENT PACEMAKER INSERTION (N/A) Subjective: Recurrent right pneumothorax discussed with Dr. Cathi [name] from [facility]. After I was able to review the latest chest x-ray from approximately an hour ago it was apparent the chest tube in place was nonfunctional, probably occluded with blood clot. I arrived in the patient's room with a chest tube tray to insert a new chest tube in the previously placed incision. At that time Dr. Felipa [name] was finishing placing the pigtail catheter which is decompressing air from the right chest was a moderate airleak in the Pleur-evac chamber. I reviewed the chest x-ray with Dr. Felipa [name] showing good reexpansion of the lung after placement of the pigtail catheter and at this point I would not recommend placing a chest tube.  Objective: Vital signs in last 24 hours: Temp:  [97.5 F (36.4 C)-98.4 F (36.9 C)] 97.5 F (36.4 C) (01/25 0000) Pulse Rate:  [39-110] 96  (01/25 0121) Cardiac Rhythm:  [-] Ventricular paced (01/24 2000) Resp:  [14-29] 29  (01/25 0121) BP: (109-181)/(22-91) 128/65 mmHg (01/25 0100) SpO2:  [88 %-100 %] 97 % (01/25 0121) FiO2 (%):  [100 %] 100 % (01/24 1830) Weight:  [136 lb 0.4 oz (61.7 kg)-136 lb 11 oz (62 kg)] 136 lb 0.4 oz (61.7 kg) (01/24 1845)  Hemodynamic parameters for last 24 hours:   currently stable  Intake/Output from previous day: 01/24 0701 - 01/25 0700 In: 111.7 [I.V.:111.7] Out: 70 [Chest Tube:70] Intake/Output this shift: Total I/O In: 100 [I.V.:100] Out: -   Breath sounds fairly clear moderate airleak from right pleural catheter Pacemaker functioning normally Patient breathing comfortably Lab Results:  Basename 03/10/12 1215  WBC 7.4  HGB 13.1  HCT 38.6  PLT 283   BMET:  Basename 03/10/12 1655 03/10/12 1215  NA 140 141  K 3.9 4.3  CL 102 105  CO2 26 24  GLUCOSE 107* 90  BUN 9 12  CREATININE 0.71 0.70  CALCIUM 9.5 9.3    PT/INR:  Basename 03/11/12 1230  LABPROT 13.3  INR  1.02   ABG    Component Value Date/Time   PHART 7.361 03/11/2012 2023   HCO3 23.3 03/11/2012 2023   TCO2 25 03/11/2012 2023   ACIDBASEDEF 2.0 03/11/2012 2023   O2SAT 93.0 03/11/2012 2023   CBG (last 3)   Basename 03/11/12 0730  GLUCAP 77    Assessment/Plan: S/P Procedure(s) (LRB): PERMANENT PACEMAKER INSERTION (N/A) Will follow as needed   LOS: 2 days    Robin [name] 03/12/2012

## 2012-03-12 NOTE — Progress Notes (Addendum)
INITIAL NUTRITION ASSESSMENT  DOCUMENTATION CODES Per approved criteria  -Not Applicable   INTERVENTION: 1. Resource Breeze po TID, each supplement provides 250 kcal and 9 grams of protein. 2. Magic cup TID with meals, each supplement provides 290 kcal and 9 grams of protein. 3. Recommend downgrade of diet to provide more appropriate textures while dentures are missing - family and pt refused 4. RD to continue to follow nutrition care plan  NUTRITION DIAGNOSIS: Inadequate oral intake related to missing dentures and poor appetite as evidenced by family report.   Goal: Pt to meet >/= 90% of their estimated nutrition needs.  Monitor:  weight trends, lab trends, I/O's, PO intake, supplement tolerance  Reason for Assessment: MD Consult for Wound Healing  79 y.o. female  Admitting Dx: AV heart block  ASSESSMENT: Admitted with complaints of fatigue, palpitations, weakness and dizziness x 3 weeks. Typically very active at baseline. Work-up reveals AV heart block. CXR revealed bilateral pneumothorax. L & R chest tube inserted 1/24. Permanent pacemaker placed 1/24.  Chest xray revealed that R chest tube was occluded with blood clot and nonfunctional.  Pt reports that she is not eating well. States that when she went to surgery her dentures were misplaced. Since then, she has not eaten well 2/2 inability to masticate foods. This RD offered to downgrade diet to chopped, minced, or pureed, however family refused. Agreeable to [employer], pt does not like Ensure and Boost products.  Height: Ht Readings from Last 1 Encounters:  03/11/12 4\' 11"  (1.499 m)    Weight: Wt Readings from Last 1 Encounters:  03/12/12 139 lb 15.9 oz (63.5 kg)    Ideal Body Weight: 98 lb/44.8 kg  % Ideal Body Weight: 142%  Wt Readings from Last 10 Encounters:  03/12/12 139 lb 15.9 oz (63.5 kg)  03/12/12 139 lb 15.9 oz (63.5 kg)    Usual Body Weight: 137 lb  % Usual Body Weight: 100%  BMI:   Body mass index is 28.27 kg/(m^2). Overweight.  Estimated Nutritional Needs: Kcal: 1500 - 1700 kcal Protein: 75 - 85 grams protein Fluid: 1.5 - 1.8 liters daily  Skin: chest incisions  Diet Order: Cardiac  EDUCATION NEEDS: -No education needs identified at this time   Intake/Output Summary (Last 24 hours) at 03/12/12 1348 Last data filed at 03/12/12 1100  Gross per 24 hour  Intake 1643.67 ml  Output    564 ml  Net 1079.67 ml    Last BM: 1/22  Labs:   Lab 03/12/12 1208 03/10/12 1655 03/10/12 1215  NA 132* 140 141  K 4.0 3.9 4.3  CL 97 102 105  CO2 25 26 24   BUN 15 9 12   CREATININE 0.79 0.71 0.70  CALCIUM 8.8 9.5 9.3  MG -- 2.3 --  PHOS -- 3.1 --  GLUCOSE 111* 107* 90    CBG (last 3)   Basename 03/12/12 0823 03/11/12 0730  GLUCAP 104* 77    Scheduled Meds:   . aspirin EC  81 mg Oral Daily  . docusate sodium  100 mg Oral BID  . flumazenil  0.2 mg Intravenous Once  . lidocaine        Continuous Infusions:   . sodium chloride 10 mL/hr at 03/12/12 0800    Past Medical History  Diagnosis Date  . LBBB (left bundle branch block)-rate related   . Narcolepsy   . Vulva cancer   . Mobitz type 2 second degree AV block     2:1  with RBBB    Past Surgical History  Procedure Date  . Wrist fracture surgery   . Hernia repair   . Mastoidectomy revision   . Tonsillectomy   . Abdominal surgery     exploratory  . Abdominal hysterectomy   . Bowel resection     Jarold Motto MS, RD, LDN Pager: [phone] After-hours pager: [phone]

## 2012-03-12 NOTE — Procedures (Signed)
Chest Tube Insertion Procedure Note  Indications:  Clinically significant Pneumothorax  Pre-operative Diagnosis: Pneumothorax  Post-operative Diagnosis: Pneumothorax  Procedure Details  Informed consent was obtained for the procedure, including sedation.  Risks of lung perforation, hemorrhage, arrhythmia, and adverse drug reaction were discussed.   After sterile skin prep, using seldinger technique, a 14 French [place] pneumothorax catheter was placed in the right lateral 9th rib space.  Findings: None  Estimated Blood Loss:  Minimal         Specimens:  None              Complications:  None; patient tolerated the procedure well.         Disposition: ICU - extubated and stable.         Condition: stable  Overton Mam, M.D. Pulmonary and Critical Care Medicine Call [facility] with questions [phone]

## 2012-03-12 NOTE — Procedures (Signed)
Right Chest Tube Insertion Procedure Note  Indications: Worsening right pneumothorax despite right chest tube placement.  Pre-operative Diagnosis: Pneumothorax of about 65%  Post-operative Diagnosis: Pneumothorax   Procedure Details  Verbal consent was obtained over the phone for the procedure. Risks of lung perforation, hemorrhage, arrhythmia, and adverse drug reaction were discussed.  After sterile skin prep, using seldinger technique, a 14 French Wayne pneumothorax catheter was placed in the right lateral 9th rib space.  A very small amount of air was suctioned with the finder needle. The guide wire was treaded without resistance. The dilator and the catheter were inserted without resistance.  About 5 cc of dark blood were suctioned from the catheter. Immediately after placement the patient experienced severe upper abdominal pain. No air leak was detected. I decided to pull the catheter out with resolution of the pain. A follow up chest X ray showed significant improvement of the pneumothorax, now mainly apical of about 15 to 20%. The patient remained hemodynamically stable at all times during the procedure. CT surgery was consulted for evaluation and  possible chest tube placement.  Estimated Blood Loss: Minimal  Specimens: None  Complications: As discussed above. Disposition: ICU - extubated and stable.  Condition: stable   Overton Mam, MD [facility].

## 2012-03-12 NOTE — Progress Notes (Signed)
Subjective:  Having some discomfort with chest tubes. Does not appear to have labored breathing.   Objective:  Vital Signs in the last 24 hours: Temp:  [97.5 F (36.4 C)-98.4 F (36.9 C)] 98.1 F (36.7 C) (01/25 0800) Pulse Rate:  [45-110] 77  (01/25 0600) Resp:  [15-29] 15  (01/25 0600) BP: (103-181)/(22-91) 103/46 mmHg (01/25 0600) SpO2:  [88 %-100 %] 95 % (01/25 0600) FiO2 (%):  [100 %] 100 % (01/24 1830) Weight:  [61.7 kg (136 lb 0.4 oz)-63.5 kg (139 lb 15.9 oz)] 63.5 kg (139 lb 15.9 oz) (01/25 0500)  Intake/Output from previous day: 01/24 0701 - 01/25 0700 In: 1383.7 [P.O.:580; I.V.:601.7; IV Piggyback:202] Out: 564 [Urine:450; Chest Tube:114]   Physical Exam: General: Well developed, well nourished, in no acute distress, elderly. Head:  Normocephalic and atraumatic. Lungs: Chest tubes in place, normal BS anterior. Heart: Normal S1 and S2.  No murmur, rubs or gallops.  Abdomen: soft, non-tender, positive bowel sounds. Extremities: No clubbing or cyanosis. No edema. Neurologic: Alert and oriented x 3.    Lab Results:  [facility] 03/10/12 1215  WBC 7.4  HGB 13.1  PLT 283    Basename 03/10/12 1655 03/10/12 1215  NA 140 141  K 3.9 4.3  CL 102 105  CO2 26 24  GLUCOSE 107* 90  BUN 9 12  CREATININE 0.71 0.70    Basename 03/11/12 1230 03/11/12 1000  TROPONINI <0.30 <0.30   Hepatic Function Panel  Basename 03/10/12 1655  PROT 7.1  ALBUMIN 3.8  AST 17  ALT 10  ALKPHOS 97  BILITOT 0.3  BILIDIR --  IBILI --   Imaging: Dg Chest 2 View  03/10/2012  *RADIOLOGY REPORT*  Clinical Data: Heart block.  Dizziness and weakness.  CHEST - 2 VIEW  Comparison: None.  Findings: Two views of the chest were obtained.  There is a right paratracheal density that could be related to thyroid or vascular structures.  The lungs are clear without airspace disease or pulmonary edema.  Patchy densities overlying the left side of the heart could be related to overlying ribs.  Heart  size is within normal limits for size.  Trachea is midline.  IMPRESSION: No acute chest findings.   Original Report Authenticated By: Richarda Overlie, M.D.    Mr Brain Wo Contrast  03/10/2012  *RADIOLOGY REPORT*  Clinical Data: Dizziness.  Rule out stroke  MRI HEAD WITHOUT CONTRAST  Technique:  Multiplanar, multiecho pulse sequences of the brain and surrounding structures were obtained according to standard protocol without intravenous contrast.  Comparison: None  Findings: Negative for acute infarct.  Mild chronic microvascular ischemic change in the cerebral white matter and pons.  Cerebellum is intact.  Negative for intracranial hemorrhage.  No mass or edema.  Paranasal sinuses are clear.  IMPRESSION: No acute abnormality.  Mild chronic microvascular ischemia.   Original Report Authenticated By: Janeece Riggers, M.D.    Dg Chest Port 1 View  03/12/2012  *RADIOLOGY REPORT*  Clinical Data: Follow up respiratory failure.  PORTABLE CHEST - 1 VIEW  Comparison: Chest radiograph performed earlier today at 01:32 a.m.  Findings: There has been interval re-expansion of the right lung, status post placement of a right basilar chest drain.  A residual small right-sided pneumothorax is seen, measuring approximately 20% of right lung volume.  A tiny left apical pneumothorax is again noted.  The prior right-sided chest tube has been retracted slightly.  Mild bilateral atelectasis is seen.  The left lung apical chest tube is  unchanged in appearance.  The cardiomediastinal silhouette is borderline normal in size; calcification is noted at the aortic arch.  The pacemaker is seen overlying the right chest wall, with leads ending overlying the right atrium and right ventricle no acute osseous abnormalities are seen.  Scattered soft tissue air at the chest wall, more prominent on the right, appears grossly stable.  IMPRESSION:  1.  Interval re-expansion of the right lung, status post placement of a right basilar chest drain.  Residual  small right-sided pneumothorax noted, measuring 20% of right lung volume. 2.  Tiny left apical pneumothorax again noted. 3.  Mild bilateral atelectasis seen.   Original Report Authenticated By: Tonia Ghent, M.D.    Dg Chest Port 1 View  03/12/2012  *RADIOLOGY REPORT*  Clinical Data: Shortness of breath.  PORTABLE CHEST - 1 VIEW  Comparison: Several prior exams most recent examination 03/11/2012 2344 hours.  Findings: There has been a significant change since prior examination with complete collapse of the right lung.  The large right-sided pneumothorax may be under tension causing mediastinal shift to the left.  The right-sided chest tube side hole projects over the mid to lower thorax.  On this single projection this cannot be definitively confirmed as within the chest.  A hole within the right lung cannot be excluded given the recurrent right- sided pneumothorax.  Left-sided chest tube is in place.  Small left apical pneumothorax.  Pulmonary vascular congestion in the left lung with left base subsegmental atelectasis/scarring.  The  Cardiomegaly.  Pacemaker in place.  Mildly tortuous aorta. Bilateral subcutaneous emphysema.  IMPRESSION: There has been a significant change since prior examination with complete collapse of the right lung.  The large right-sided pneumothorax may be under tension causing mediastinal shift to the left.  The right-sided chest tube side hole projects over the mid to lower thorax.  On this single projection this cannot be definitively confirmed as within the chest.  A hole within the right lung cannot be excluded given the recurrent right-sided pneumothorax.  Left-sided chest tube is in place.  Small left apical pneumothorax.  Critical Value/emergent results were called by telephone at the time of interpretation on 03/12/2012 at 1:44 a.m. to [facility] patient's nurse, who verbally acknowledged these results.  Phone conversation with Dr. Emmaline Kluver 03/12/2012 1:55 a.m.   Original Report  Authenticated By: Lacy Duverney, M.D.    Dg Chest Port 1 View  03/12/2012  *RADIOLOGY REPORT*  Clinical Data: Reinsertion chest tube.  PORTABLE CHEST - 1 VIEW  Comparison: 03/11/2012 9:53 p.m.  Findings: Right-sided chest tube is in place.  There has been a decrease in the size of the right-sided pneumothorax with a 15 - 20% pneumothorax remaining.  Left-sided chest tube is in place.  Decrease in size of left apical pneumothorax and a tiny in size.  Bilateral subcutaneous emphysema.  Re-expansion of the right lung with residual bibasilar atelectasis noted.  Mediastinal shift to the left without significant change.  Calcified aorta.  Sequential pacemaker enters from the right with leads unchanged in position.  IMPRESSION: Decrease in size of right-sided pneumothorax and re-expansion of right lower lobe.  15 - 20%  right-sided apical pneumothorax remains.  There may be a small inferior component.  Decrease in size of left-sided pneumothorax now tiny in size.  Critical Value/emergent results were called by telephone at the time of interpretation on 03/12/2012 at 12:08 p.m. to [facility] the patient's nurse, who verbally acknowledged these results.   Original Report Authenticated By: Lacy Duverney,  M.D.    Dg Chest Port 1 View  03/11/2012  *RADIOLOGY REPORT*  Clinical Data: Follow up right-sided tension pneumothorax.  PORTABLE CHEST - 1 VIEW  Comparison: Chest radiograph performed earlier today at 08:18 p.m.  Findings: The right-sided pneumothorax has increased significantly in size, now measuring approximately 65% of right lung volume, with partial collapse of the right lung noted at the right lung base. Patchy left-sided atelectasis is again seen.  A tiny residual left- sided pneumothorax is noted.  The patient's bilateral chest tubes are unchanged in appearance.  There is mild leftward mediastinal shift, raising concern for tension pneumothorax.  The cardiomediastinal silhouette is otherwise grossly unremarkable.  A  pacemaker is seen overlying the right chest wall, with leads ending overlying the right atrium and right ventricle.  No acute osseous abnormalities are seen.  Soft tissue air is noted along the chest wall bilaterally, much more prominent on the right.  This is grossly stable from the prior chest radiograph.  IMPRESSION:  1.  Significant interval increase in size of right-sided pneumothorax, now measuring 65% of right lung volume, with partial collapse of the right lung.  Mild leftward mediastinal shift raises concern for tension pneumothorax.  Would turn off suction, reposition the right-sided chest tube and turn on suction; the chest tube may currently abut the right lung. 2.  Mild interval decrease in size of tiny left apical pneumothorax. 3.  Stable appearance with regard to soft tissue air along the chest wall bilaterally, much more prominent on the right.  These results were called by telephone on 03/11/2012 at 10:09 p.m. to Nursing on [id number], who verbally acknowledged these results.   Original Report Authenticated By: Tonia Ghent, M.D.    [facility] 1 View  03/11/2012  *RADIOLOGY REPORT*  Clinical Data: Reevaluate pneumothorax.  PORTABLE CHEST - 1 VIEW  Comparison: 03/11/2012 5:43 p.m.  03/10/2012  Findings: Bilateral chest tubes are in place.  Left apical pneumothorax without significant change.  Increased amount of left chest wall subcutaneous emphysema.  Increase in size of right-sided pneumothorax now approximately 25%. This appears across midline.  Degree of midline shift to the left without significant change as may be expected with tension pneumothorax.  Increase in amount of right-sided subcutaneous emphysema.  Dual lead pacemaker is in place entering from the right with leads unchanged position.  Mild cardiomegaly.  Central pulmonary vascular prominence.  Calcified tortuous aorta.  IMPRESSION: Increase in size of right-sided pneumothorax now approximately 25%. Degree of tension not entirely  excluded.  No significant change left apical pneumothorax.  Increase in bilateral chest subcutaneous emphysema.  Critical Value/emergent results were called by telephone at the time of interpretation on 03/11/2012 at 9:14 p.m. to [facility] patient's nurse., who verbally acknowledged these results.   Original Report Authenticated By: Lacy Duverney, M.D.    Dg Chest Port 1 View  03/11/2012  *RADIOLOGY REPORT*  Clinical Data: Bilateral chest tube placement  PORTABLE CHEST - 1 VIEW  Comparison: Prior films same day  Findings: Cardiomediastinal silhouette is stable.  Dual lead cardiac pacemaker is unchanged in position.  Bilateral chest tube in place noted.  Small residual right apical pneumothorax.  There is approximately 25% -30 percent left upper pneumothorax.  There is peripheral and basilar atelectasis of the left lung.  IMPRESSION:  Dual lead cardiac pacemaker is unchanged in position.  Bilateral chest tube in place noted.  Small residual right apical pneumothorax.  There is approximately 25% -30 percent left upper pneumothorax.  There is  peripheral and basilar atelectasis of the left lung.   Original Report Authenticated By: Natasha Mead, M.D.    Dg Chest Port 1 View  03/11/2012  *RADIOLOGY REPORT*  Clinical Data: Chest pain.  Pacemaker insertion.  PORTABLE CHEST - 1 VIEW  Comparison: Chest x-ray 80 03/10/2012.  Findings: The permanent right-sided pacemaker and right atrial and particular wires are in good position. There are bilateral 25% pneumothoraces.  Mild compressive lower lobe atelectasis and underlying emphysematous changes.  IMPRESSION:  1.  Pacer wires in good position. 2.  Bilateral 25% pneumothoraces.  Findings called directly to the cath lab.   Original Report Authenticated By: Rudie Meyer, M.D.    Personally viewed.   Telemetry: Pacer functioning well Personally viewed.    Cardiac Studies:  EF 60%  Assessment/Plan:  Principal Problem:  *AV heart block Active Problems:  Acute on chronic  diastolic heart failure  Bilateral pneumothoraces  -appreciate CCM, TCTS assistance.  -CCM monitoring chest tubes -spoke to Dr. Tera Helper yesterday post placement.  -Notes from last evening night reviewed. -CXR's viewed  -Monitoring for any further dizziness.  SKAINS, MARK 03/12/2012, 9:31 AM

## 2012-03-12 NOTE — Op Note (Signed)
Robin [name], Robin [name] NO.:  [id number]  MEDICAL RECORD NO.:  [id number]  LOCATION:  2908                         FACILITY:  [facility]  PHYSICIAN:  Duke Salvia, MD, FACCDATE OF BIRTH:  [birth year]  DATE OF PROCEDURE:  03/11/2012 DATE OF DISCHARGE:                              OPERATIVE REPORT   PREOPERATIVE DIAGNOSIS:  2:1 heart block.  POSTOPERATIVE DIAGNOSIS:  2:1 heart block; occlusion-iatrogenic of the left subclavian vein.  COMPLICATIONS:  Inability to cannulate the left subclavian vein; aspiration of air in the left lung.  PROCEDURE:  Following obtaining informed consent, the patient was brought to the electrophysiology laboratory and placed on the fluoroscopic table in the supine position.  After routine prep and drape of the left upper chest, lidocaine was infiltrated in prepectoral subclavicular region.  Incision was made and carried down to layer of the prepectoral fascia.  Electrocautery and sharp dissection.  A pocket was formed similarly.  Hemostasis was obtained.  Thereafter, attention was turned to gain access to the extrathoracic left subclavian vein, which turned out to be incredibly difficult.  In fact, it turned out to be from me impossible.  We initially tried a standard extrathoracic approach.  On 2 occasions, I ended up below the 1st rib and in the lung.  On 2 or 3 occasions, I was able to cannulate a vessel.  I was unable to pass a wire.  It was not clear to me whether the vessel was an artery or vein.  I then undertook a standard subclavian approach.  I was able to cannulate the vessel.  We used a micropuncture kit.  I put a micropuncture wire in and when it got down into the mid thorax it behaved as it were in the aorta bouncing back on the aortic valve.  It had been elected prior to this to do a venogram on the left side, which demonstrated a poorly filling branch.  It was not clear whether this was related to the IV or 2 more  central issues.  We undertook a 2nd venogram intermediate to all of this and there appeared to be occlusion of the left subclavian vein with contrast hang-up in the axillary vein.  At this juncture, I elected to abandon the left side.  I reviewed the aforementioned events with the family.  We then prepped the right side.  It was elected to proceed contralaterally to a possible pneumothorax related to aspiration of air, but given the patient's unstable cardiac condition with 2:1 block, which had been undetermined, but recent onset with right bundle-branch block and a history of left bundle branch block.  I elected to proceed given the unstable nature and potential urgency of the situation.  A venogram was obtained on the right side.  It demonstrated patency in the course of the extrathoracic right subclavian vein.  We then undertook a repreparation with the patient.  Lidocaine was infiltrated on the right side.  Incision was made and carried down to layer of the prepectoral fascia.  The pocket was formed.  Venous access was obtained with mild difficulty.  Because of this, a double wire approach was utilized and sequentially 7-French sheaths were placed, which were  passed a [employer] 2080 TC 52-cm active fixation ventricular lead, serial number [id number] and the atrial lead serial # [id number].  Under fluoroscopic guidance, these were manipulated with right ventricular apex and the right atrial appendage respectively where the bipolar R-wave was 5.2 with a pace impedance of 136, a threshold 1.2 V at 0.5 milliseconds.  Current threshold was 0.9 mA.  There is no diaphragmatic pacing at 10 V and the current of injury was brisk.  Bipolar P-wave was 4.1 with a pace impedance of 576, a threshold 1 V at 0.5, current threshold 1.6 mA.  There is no diaphragmatic pacing at 10 V and the current of injury was brisk.  These leads secured the prepectoral fascia, and the ventricular lead was marked with a  tie prior to insertion of the atrial lead.  The pocket was copiously irrigated with antibiotic containing saline solution.  Hemostasis was assured. Leads and pulse generator were placed in the pocket secured to the prepectoral fascia.  The wound was then closed in 3 layers in normal fashion.  The wound was washed, dried, and a benzoin Steri-Strips was applied.  Needle counts, sponge counts, and instrument counts were correct at the end of the procedure according to the staff.  The patient tolerated the procedure.  Notwithstanding the aforementioned difficulties that I had without apparent difficulty.  Chest x-rays are pending at this time.     Duke Salvia, MD, [facility]     SCK/MEDQ  D:  03/11/2012  T:  03/12/2012  Job:  [id number]

## 2012-03-12 NOTE — Progress Notes (Signed)
PULMONARY  / CRITICAL CARE MEDICINE  Name: Nashika Coker MRN: [id number] DOB: Mar 23, 1928    LOS: 2  REFERRING PROVIDER:  Dr. Graciela Husbands  CHIEF COMPLAINT:  Bilateral Pneumothorax  BRIEF PATIENT DESCRIPTION: 79 y/o F with PMH of Narcolepsy, LBBB -rate related,  Mobitz 2 AV block with RBBB admitted on 1/24 for dual chamber pacemaker insertion.  Both sides attempted -post procedure, patient was noted to have mild chest pain. CXR demonstrated bilateral pneumothorax.    LINES / TUBES: 1/24 R CT x 2 >>> 1/24 L CT>>>   SIGNIFICANT EVENTS:  1/24 - Admit for dual chamber pacer, bilateral PTX requiring CT x2  INTERVAL HISTORY: c/o pain at chest tube sites breathign Ok Events overnight noted  VITAL SIGNS: Temp:  [97.5 F (36.4 C)-98.4 F (36.9 C)] 98.1 F (36.7 C) (01/25 0800) Pulse Rate:  [45-110] 77  (01/25 0600) Resp:  [15-29] 15  (01/25 0600) BP: (103-181)/(22-91) 103/46 mmHg (01/25 0600) SpO2:  [88 %-100 %] 95 % (01/25 0600) FiO2 (%):  [100 %] 100 % (01/24 1830) Weight:  [61.7 kg (136 lb 0.4 oz)-63.5 kg (139 lb 15.9 oz)] 63.5 kg (139 lb 15.9 oz) (01/25 0500)  PHYSICAL EXAMINATION: General:  Elderly female s/p bilateral CT placement Neuro:  AAOx4 pre-procedure, MAE HEENT:  Mm pink/moist, no jvd Cardiovascular:  s1s2 rrr, no m/r/g Lungs:  resp's even/non-labored, lungs bilaterally diminished, left-air leak +, right - no air leak on either CT Abdomen:  Round/soft, bsx4 active Musculoskeletal: no acute deform Skin:  Warm/dry, no edema   Lab 03/10/12 1655 03/10/12 1215  NA 140 141  K 3.9 4.3  CL 102 105  CO2 26 24  BUN 9 12  CREATININE 0.71 0.70  GLUCOSE 107* 90    Lab 03/10/12 1215  HGB 13.1  HCT 38.6  WBC 7.4  PLT 283   Dg Chest 2 View  03/10/2012  *RADIOLOGY REPORT*  Clinical Data: Heart block.  Dizziness and weakness.  CHEST - 2 VIEW  Comparison: None.  Findings: Two views of the chest were obtained.  There is a right paratracheal density that could be  related to thyroid or vascular structures.  The lungs are clear without airspace disease or pulmonary edema.  Patchy densities overlying the left side of the heart could be related to overlying ribs.  Heart size is within normal limits for size.  Trachea is midline.  IMPRESSION: No acute chest findings.   Original Report Authenticated By: Richarda Overlie, M.D.    Mr Brain Wo Contrast  03/10/2012  *RADIOLOGY REPORT*  Clinical Data: Dizziness.  Rule out stroke  MRI HEAD WITHOUT CONTRAST  Technique:  Multiplanar, multiecho pulse sequences of the brain and surrounding structures were obtained according to standard protocol without intravenous contrast.  Comparison: None  Findings: Negative for acute infarct.  Mild chronic microvascular ischemic change in the cerebral white matter and pons.  Cerebellum is intact.  Negative for intracranial hemorrhage.  No mass or edema.  Paranasal sinuses are clear.  IMPRESSION: No acute abnormality.  Mild chronic microvascular ischemia.   Original Report Authenticated By: Janeece Riggers, M.D.    Dg Chest Port 1 View  03/12/2012  *RADIOLOGY REPORT*  Clinical Data: Follow up respiratory failure.  PORTABLE CHEST - 1 VIEW  Comparison: Chest radiograph performed earlier today at 01:32 a.m.  Findings: There has been interval re-expansion of the right lung, status post placement of a right basilar chest drain.  A residual small right-sided pneumothorax is seen, measuring approximately 20% of  right lung volume.  A tiny left apical pneumothorax is again noted.  The prior right-sided chest tube has been retracted slightly.  Mild bilateral atelectasis is seen.  The left lung apical chest tube is unchanged in appearance.  The cardiomediastinal silhouette is borderline normal in size; calcification is noted at the aortic arch.  The pacemaker is seen overlying the right chest wall, with leads ending overlying the right atrium and right ventricle no acute osseous abnormalities are seen.  Scattered soft  tissue air at the chest wall, more prominent on the right, appears grossly stable.  IMPRESSION:  1.  Interval re-expansion of the right lung, status post placement of a right basilar chest drain.  Residual small right-sided pneumothorax noted, measuring 20% of right lung volume. 2.  Tiny left apical pneumothorax again noted. 3.  Mild bilateral atelectasis seen.   Original Report Authenticated By: Tonia Ghent, M.D.    Dg Chest Port 1 View  03/12/2012  *RADIOLOGY REPORT*  Clinical Data: Shortness of breath.  PORTABLE CHEST - 1 VIEW  Comparison: Several prior exams most recent examination 03/11/2012 2344 hours.  Findings: There has been a significant change since prior examination with complete collapse of the right lung.  The large right-sided pneumothorax may be under tension causing mediastinal shift to the left.  The right-sided chest tube side hole projects over the mid to lower thorax.  On this single projection this cannot be definitively confirmed as within the chest.  A hole within the right lung cannot be excluded given the recurrent right- sided pneumothorax.  Left-sided chest tube is in place.  Small left apical pneumothorax.  Pulmonary vascular congestion in the left lung with left base subsegmental atelectasis/scarring.  The  Cardiomegaly.  Pacemaker in place.  Mildly tortuous aorta. Bilateral subcutaneous emphysema.  IMPRESSION: There has been a significant change since prior examination with complete collapse of the right lung.  The large right-sided pneumothorax may be under tension causing mediastinal shift to the left.  The right-sided chest tube side hole projects over the mid to lower thorax.  On this single projection this cannot be definitively confirmed as within the chest.  A hole within the right lung cannot be excluded given the recurrent right-sided pneumothorax.  Left-sided chest tube is in place.  Small left apical pneumothorax.  Critical Value/emergent results were called by  telephone at the time of interpretation on 03/12/2012 at 1:44 a.m. to [facility] patient's nurse, who verbally acknowledged these results.  Phone conversation with Dr. Emmaline Kluver 03/12/2012 1:55 a.m.   Original Report Authenticated By: Lacy Duverney, M.D.    Dg Chest Port 1 View  03/12/2012  *RADIOLOGY REPORT*  Clinical Data: Reinsertion chest tube.  PORTABLE CHEST - 1 VIEW  Comparison: 03/11/2012 9:53 p.m.  Findings: Right-sided chest tube is in place.  There has been a decrease in the size of the right-sided pneumothorax with a 15 - 20% pneumothorax remaining.  Left-sided chest tube is in place.  Decrease in size of left apical pneumothorax and a tiny in size.  Bilateral subcutaneous emphysema.  Re-expansion of the right lung with residual bibasilar atelectasis noted.  Mediastinal shift to the left without significant change.  Calcified aorta.  Sequential pacemaker enters from the right with leads unchanged in position.  IMPRESSION: Decrease in size of right-sided pneumothorax and re-expansion of right lower lobe.  15 - 20%  right-sided apical pneumothorax remains.  There may be a small inferior component.  Decrease in size of left-sided pneumothorax now tiny in size.  Critical Value/emergent results were called by telephone at the time of interpretation on 03/12/2012 at 12:08 p.m. to [facility] the patient's nurse, who verbally acknowledged these results.   Original Report Authenticated By: Lacy Duverney, M.D.    Dg Chest Port 1 View  03/11/2012  *RADIOLOGY REPORT*  Clinical Data: Follow up right-sided tension pneumothorax.  PORTABLE CHEST - 1 VIEW  Comparison: Chest radiograph performed earlier today at 08:18 p.m.  Findings: The right-sided pneumothorax has increased significantly in size, now measuring approximately 65% of right lung volume, with partial collapse of the right lung noted at the right lung base. Patchy left-sided atelectasis is again seen.  A tiny residual left- sided pneumothorax is noted.  The patient's  bilateral chest tubes are unchanged in appearance.  There is mild leftward mediastinal shift, raising concern for tension pneumothorax.  The cardiomediastinal silhouette is otherwise grossly unremarkable.  A pacemaker is seen overlying the right chest wall, with leads ending overlying the right atrium and right ventricle.  No acute osseous abnormalities are seen.  Soft tissue air is noted along the chest wall bilaterally, much more prominent on the right.  This is grossly stable from the prior chest radiograph.  IMPRESSION:  1.  Significant interval increase in size of right-sided pneumothorax, now measuring 65% of right lung volume, with partial collapse of the right lung.  Mild leftward mediastinal shift raises concern for tension pneumothorax.  Would turn off suction, reposition the right-sided chest tube and turn on suction; the chest tube may currently abut the right lung. 2.  Mild interval decrease in size of tiny left apical pneumothorax. 3.  Stable appearance with regard to soft tissue air along the chest wall bilaterally, much more prominent on the right.  These results were called by telephone on 03/11/2012 at 10:09 p.m. to Nursing on [id number], who verbally acknowledged these results.   Original Report Authenticated By: Tonia Ghent, M.D.    [facility] 1 View  03/11/2012  *RADIOLOGY REPORT*  Clinical Data: Reevaluate pneumothorax.  PORTABLE CHEST - 1 VIEW  Comparison: 03/11/2012 5:43 p.m.  03/10/2012  Findings: Bilateral chest tubes are in place.  Left apical pneumothorax without significant change.  Increased amount of left chest wall subcutaneous emphysema.  Increase in size of right-sided pneumothorax now approximately 25%. This appears across midline.  Degree of midline shift to the left without significant change as may be expected with tension pneumothorax.  Increase in amount of right-sided subcutaneous emphysema.  Dual lead pacemaker is in place entering from the right with leads unchanged  position.  Mild cardiomegaly.  Central pulmonary vascular prominence.  Calcified tortuous aorta.  IMPRESSION: Increase in size of right-sided pneumothorax now approximately 25%. Degree of tension not entirely excluded.  No significant change left apical pneumothorax.  Increase in bilateral chest subcutaneous emphysema.  Critical Value/emergent results were called by telephone at the time of interpretation on 03/11/2012 at 9:14 p.m. to [facility] patient's nurse., who verbally acknowledged these results.   Original Report Authenticated By: Lacy Duverney, M.D.    Dg Chest Port 1 View  03/11/2012  *RADIOLOGY REPORT*  Clinical Data: Bilateral chest tube placement  PORTABLE CHEST - 1 VIEW  Comparison: Prior films same day  Findings: Cardiomediastinal silhouette is stable.  Dual lead cardiac pacemaker is unchanged in position.  Bilateral chest tube in place noted.  Small residual right apical pneumothorax.  There is approximately 25% -30 percent left upper pneumothorax.  There is peripheral and basilar atelectasis of the left lung.  IMPRESSION:  Dual lead cardiac pacemaker is unchanged in position.  Bilateral chest tube in place noted.  Small residual right apical pneumothorax.  There is approximately 25% -30 percent left upper pneumothorax.  There is peripheral and basilar atelectasis of the left lung.   Original Report Authenticated By: Natasha Mead, M.D.    Dg Chest Port 1 View  03/11/2012  *RADIOLOGY REPORT*  Clinical Data: Chest pain.  Pacemaker insertion.  PORTABLE CHEST - 1 VIEW  Comparison: Chest x-ray 80 03/10/2012.  Findings: The permanent right-sided pacemaker and right atrial and particular wires are in good position. There are bilateral 25% pneumothoraces.  Mild compressive lower lobe atelectasis and underlying emphysematous changes.  IMPRESSION:  1.  Pacer wires in good position. 2.  Bilateral 25% pneumothoraces.  Findings called directly to the cath lab.   Original Report Authenticated By: Rudie Meyer, M.D.       ASSESSMENT / PLAN:  Bilateral Pneumothorax Mild LLL ATX  79 y/o F admitted for dual chamber pacemaker placement.  Bilateral sides attempted for pacemaker placement.  Post procedure CXR demonstrated bilateral 25% PTX.  Bilateral CT's placed -rt shows 20% ersidual, lt air leak +  Plan: -CT  bilaterally to suction -CT's to 20 cm suction -O2 to support sats >93% -hold abx, monitor fever curve & leukocytosis with LLL atx  Appreciate Ct surgery involvement, hope to release sling today &    Cyril Mourning MD. Tonny Bollman. [place] [facility] Pager [phone] If no response call [phone]   03/12/2012, 10:03 AM

## 2012-03-12 NOTE — Progress Notes (Signed)
Pt attempted to get oob to recliner at bedside; unable to stand up fully or ambulate due to significant pain right lateral chest wall pain; pt requested to "just sit on edge of bed for a minute"; pt assisted back to bed per pt request; family at bedside; questions enocuraged and answered; will continue to monitor

## 2012-03-13 ENCOUNTER — Inpatient Hospital Stay (HOSPITAL_COMMUNITY): Payer: Medicare Other | Admitting: Anesthesiology

## 2012-03-13 ENCOUNTER — Encounter (HOSPITAL_COMMUNITY): Payer: Self-pay | Admitting: Anesthesiology

## 2012-03-13 ENCOUNTER — Encounter (HOSPITAL_COMMUNITY)
Admission: EM | Disposition: A | Discharge status: Home / Self Care | Payer: Self-pay | Source: Home / Self Care | Attending: Interventional Cardiology

## 2012-03-13 ENCOUNTER — Inpatient Hospital Stay (HOSPITAL_COMMUNITY): Payer: Medicare Other

## 2012-03-13 DIAGNOSIS — J930 Spontaneous tension pneumothorax: Secondary | ICD-10-CM

## 2012-03-13 HISTORY — PX: CHEST TUBE INSERTION: SHX231

## 2012-03-13 LAB — CBC
HCT: 33 % — ABNORMAL LOW (ref 36.0–46.0)
Hemoglobin: 11.5 g/dL — ABNORMAL LOW (ref 12.0–15.0)
MCH: 31.2 pg (ref 26.0–34.0)
MCHC: 34.8 g/dL (ref 30.0–36.0)
MCV: 89.4 fL (ref 78.0–100.0)
Platelets: 205 10*3/uL (ref 150–400)
RBC: 3.69 MIL/uL — ABNORMAL LOW (ref 3.87–5.11)
RDW: 12.9 % (ref 11.5–15.5)
WBC: 9.3 10*3/uL (ref 4.0–10.5)

## 2012-03-13 LAB — COMPREHENSIVE METABOLIC PANEL
ALT: 22 U/L (ref 0–35)
AST: 27 U/L (ref 0–37)
Albumin: 3.3 g/dL — ABNORMAL LOW (ref 3.5–5.2)
Alkaline Phosphatase: 75 U/L (ref 39–117)
BUN: 12 mg/dL (ref 6–23)
CO2: 26 mEq/L (ref 19–32)
Calcium: 9.2 mg/dL (ref 8.4–10.5)
Chloride: 101 mEq/L (ref 96–112)
Creatinine, Ser: 0.62 mg/dL (ref 0.50–1.10)
GFR calc Af Amer: >90 mL/min (ref >90)
GFR calc non Af Amer: 82 mL/min — ABNORMAL LOW (ref >90)
Glucose, Bld: 101 mg/dL — ABNORMAL HIGH (ref 70–99)
Potassium: 4.7 mEq/L (ref 3.5–5.1)
Sodium: 137 mEq/L (ref 135–145)
Total Bilirubin: 0.4 mg/dL (ref 0.3–1.2)
Total Protein: 6.5 g/dL (ref 6.0–8.3)

## 2012-03-13 LAB — TYPE AND SCREEN
ABO/RH(D): O NEG
Antibody Screen: NEGATIVE

## 2012-03-13 LAB — SURGICAL PCR SCREEN
MRSA, PCR: NEGATIVE
Staphylococcus aureus: NEGATIVE

## 2012-03-13 LAB — APTT: aPTT: 27 seconds (ref 24–37)

## 2012-03-13 LAB — PROTIME-INR
INR: 1.01 (ref 0.00–1.49)
Prothrombin Time: 13.2 seconds (ref 11.6–15.2)

## 2012-03-13 LAB — GLUCOSE, CAPILLARY: Glucose-Capillary: 86 mg/dL (ref 70–99)

## 2012-03-13 SURGERY — CHEST TUBE INSERTION
Anesthesia: Monitor Anesthesia Care | Site: Chest | Laterality: Right | Wound class: Clean

## 2012-03-13 MED ORDER — ONDANSETRON HCL 4 MG/2ML IJ SOLN: 4.0000 mg | Freq: Once | INTRAMUSCULAR | Status: DC | PRN

## 2012-03-13 MED ORDER — TRAMADOL HCL 50 MG PO TABS
50.0000 mg | ORAL_TABLET | Freq: Four times a day (QID) | ORAL | Status: DC | PRN
  Administered 2012-03-18 – 2012-03-19 (×3): 50 mg via ORAL
  Filled 2012-03-13 (×4): qty 1

## 2012-03-13 MED ORDER — HYDROMORPHONE HCL PF 1 MG/ML IJ SOLN
INTRAMUSCULAR | Status: AC
  Filled 2012-03-13: qty 1

## 2012-03-13 MED ORDER — MEPERIDINE HCL 25 MG/ML IJ SOLN: 6.2500 mg | INTRAMUSCULAR | Status: DC | PRN

## 2012-03-13 MED ORDER — FENTANYL CITRATE 0.05 MG/ML IJ SOLN
INTRAMUSCULAR | Status: AC
  Filled 2012-03-13: qty 2

## 2012-03-13 MED ORDER — BISACODYL 5 MG PO TBEC
5.0000 mg | DELAYED_RELEASE_TABLET | Freq: Two times a day (BID) | ORAL | Status: DC
  Administered 2012-03-13 – 2012-03-14 (×3): 5 mg via ORAL
  Filled 2012-03-13 (×3): qty 1

## 2012-03-13 MED ORDER — OXYCODONE HCL 5 MG PO TABS
5.0000 mg | ORAL_TABLET | ORAL | Status: DC | PRN
  Administered 2012-03-13 – 2012-03-16 (×12): 5 mg via ORAL
  Filled 2012-03-13 (×12): qty 1

## 2012-03-13 MED ORDER — POTASSIUM CHLORIDE IN NACL 20-0.9 MEQ/L-% IV SOLN
INTRAVENOUS | Status: DC
  Administered 2012-03-13 – 2012-03-15 (×2): via INTRAVENOUS
  Filled 2012-03-13 (×4): qty 1000

## 2012-03-13 MED ORDER — ONDANSETRON HCL 4 MG/2ML IJ SOLN: 4.0000 mg | Freq: Four times a day (QID) | INTRAMUSCULAR | Status: DC | PRN

## 2012-03-13 MED ORDER — HYDROMORPHONE HCL PF 1 MG/ML IJ SOLN
0.2500 mg | INTRAMUSCULAR | Status: DC | PRN
  Administered 2012-03-13 (×4): 0.5 mg via INTRAVENOUS

## 2012-03-13 MED ORDER — VANCOMYCIN HCL IN DEXTROSE 1-5 GM/200ML-% IV SOLN
1000.0000 mg | INTRAVENOUS | Status: AC
  Administered 2012-03-13: 1000 mg via INTRAVENOUS
  Filled 2012-03-13 (×2): qty 200

## 2012-03-13 MED ORDER — OXYCODONE HCL 5 MG PO TABS
ORAL_TABLET | ORAL | Status: AC
  Filled 2012-03-13: qty 1

## 2012-03-13 MED ORDER — PROPOFOL INFUSION 10 MG/ML OPTIME
INTRAVENOUS | Status: DC | PRN
  Administered 2012-03-13: 50 ug/kg/min via INTRAVENOUS

## 2012-03-13 MED ORDER — BISACODYL 5 MG PO TBEC: 10.0000 mg | DELAYED_RELEASE_TABLET | Freq: Every day | ORAL | Status: DC

## 2012-03-13 MED ORDER — VANCOMYCIN HCL IN DEXTROSE 1-5 GM/200ML-% IV SOLN
1000.0000 mg | Freq: Two times a day (BID) | INTRAVENOUS | Status: AC
  Administered 2012-03-14: 1000 mg via INTRAVENOUS
  Filled 2012-03-13 (×2): qty 200

## 2012-03-13 MED ORDER — ENOXAPARIN SODIUM 30 MG/0.3ML ~~LOC~~ SOLN
40.0000 mg | SUBCUTANEOUS | Status: DC
  Administered 2012-03-14 – 2012-03-21 (×8): 40 mg via SUBCUTANEOUS
  Filled 2012-03-13 (×9): qty 0.4

## 2012-03-13 MED ORDER — OXYCODONE HCL 5 MG PO TABS
5.0000 mg | ORAL_TABLET | Freq: Once | ORAL | Status: AC | PRN
  Administered 2012-03-13: 5 mg via ORAL

## 2012-03-13 MED ORDER — SENNOSIDES-DOCUSATE SODIUM 8.6-50 MG PO TABS: 1.0000 | ORAL_TABLET | Freq: Every evening | ORAL | Status: DC | PRN

## 2012-03-13 MED ORDER — FENTANYL CITRATE 0.05 MG/ML IJ SOLN
INTRAMUSCULAR | Status: DC | PRN
  Administered 2012-03-13: 75 ug via INTRAVENOUS
  Administered 2012-03-13: 50 ug via INTRAVENOUS

## 2012-03-13 MED ORDER — LIDOCAINE HCL 1 % IJ SOLN
INTRAMUSCULAR | Status: DC | PRN
  Administered 2012-03-13: 20 mL

## 2012-03-13 MED ORDER — TRAMADOL HCL 50 MG PO TABS: 50.0000 mg | ORAL_TABLET | Freq: Four times a day (QID) | ORAL | Status: DC | PRN

## 2012-03-13 MED ORDER — OXYCODONE HCL 5 MG/5ML PO SOLN: 5.0000 mg | Freq: Once | ORAL | Status: AC | PRN

## 2012-03-13 MED ORDER — LACTATED RINGERS IV SOLN
INTRAVENOUS | Status: DC | PRN
  Administered 2012-03-13: 15:00:00 via INTRAVENOUS

## 2012-03-13 MED ORDER — ACETAMINOPHEN 10 MG/ML IV SOLN
1000.0000 mg | Freq: Four times a day (QID) | INTRAVENOUS | Status: AC
  Administered 2012-03-13 – 2012-03-14 (×4): 1000 mg via INTRAVENOUS
  Filled 2012-03-13 (×4): qty 100

## 2012-03-13 SURGICAL SUPPLY — 14 items
CATH THORACIC 28FR (CATHETERS) ×3 IMPLANT
CATH THORACIC 36FR (CATHETERS) IMPLANT
CLOTH BEACON ORANGE TIMEOUT ST (SAFETY) ×3 IMPLANT
GLOVE BIO SURGEON STRL SZ7.5 (GLOVE) ×6 IMPLANT
GOWN PREVENTION PLUS XLARGE (GOWN DISPOSABLE) ×3 IMPLANT
GOWN STRL NON-REIN LRG LVL3 (GOWN DISPOSABLE) ×3 IMPLANT
KIT ROOM TURNOVER OR (KITS) ×3 IMPLANT
PAD ARMBOARD 7.5X6 YLW CONV (MISCELLANEOUS) ×6 IMPLANT
SPONGE GAUZE 4X4 12PLY (GAUZE/BANDAGES/DRESSINGS) ×3 IMPLANT
SUT SILK  1 MH (SUTURE) ×4
SUT SILK 1 MH (SUTURE) ×2 IMPLANT
SYSTEM SAHARA CHEST DRAIN RE-I (WOUND CARE) ×3 IMPLANT
TAPE CLOTH SURG 4X10 WHT LF (GAUZE/BANDAGES/DRESSINGS) ×3 IMPLANT
TOWEL OR 17X24 6PK STRL BLUE (TOWEL DISPOSABLE) ×3 IMPLANT

## 2012-03-13 NOTE — Anesthesia Postprocedure Evaluation (Signed)
Anesthesia Post Note  Patient: Robin [name]  Procedure(s) Performed: Procedure(s) (LRB): CHEST TUBE INSERTION (Right)  Anesthesia type: general  Patient location: PACU  Post pain: Pain level controlled  Post assessment: Patient's Cardiovascular Status Stable  Last Vitals:  Filed Vitals:   03/13/12 1545  BP: 135/58  Pulse: 96  Temp: 36.7 C  Resp: 28    Post vital signs: Reviewed and stable  Level of consciousness: sedated  Complications: No apparent anesthesia complications

## 2012-03-13 NOTE — Progress Notes (Signed)
2 Days Post-Op Procedure(s) (LRB): PERMANENT PACEMAKER INSERTION (N/A) Subjective: Persistent 20% R pneumothorax despite pig tail, cont airleak post PPM  Objective: Vital signs in last 24 hours: Temp:  [98.2 F (36.8 C)-99 F (37.2 C)] 98.3 F (36.8 C) (01/26 0300) Pulse Rate:  [65-95] 68  (01/26 0700) Cardiac Rhythm:  [-] Ventricular paced (01/25 1930) Resp:  [0-25] 15  (01/26 0700) BP: (98-125)/(43-60) 101/46 mmHg (01/26 0700) SpO2:  [88 %-100 %] 98 % (01/26 0700)  Hemodynamic parameters for last 24 hours:  stable  Intake/Output from previous day: 01/25 0701 - 01/26 0700 In: 1440 [P.O.:1380; I.V.:60] Out: 835 [Urine:725; Chest Tube:110] Intake/Output this shift:    Positive air leak  Lab Results:  [facility] 03/12/12 1208 03/10/12 1215  WBC 11.9* 7.4  HGB 11.5* 13.1  HCT 33.1* 38.6  PLT 231 283   BMET:  Basename 03/12/12 1208 03/10/12 1655  NA 132* 140  K 4.0 3.9  CL 97 102  CO2 25 26  GLUCOSE 111* 107*  BUN 15 9  CREATININE 0.79 0.71  CALCIUM 8.8 9.5    PT/INR:  Basename 03/11/12 1230  LABPROT 13.3  INR 1.02   ABG    Component Value Date/Time   PHART 7.361 03/11/2012 2023   HCO3 23.3 03/11/2012 2023   TCO2 25 03/11/2012 2023   ACIDBASEDEF 2.0 03/11/2012 2023   O2SAT 93.0 03/11/2012 2023   CBG (last 3)   Basename 03/13/12 0805 03/12/12 0823 03/11/12 0730  GLUCAP 86 104* 77  ff    Assessment/Plan: S/P Procedure(s) (LRB): PERMANENT PACEMAKER INSERTION (N/A) A larger chest tube will help to fully inflate the lung on R and facilitate closure of air leak- will post  In OR under MAC anesthesia today-- keep NPO   LOS: 3 days  Air  fff    VAN TRIGT III,Freddie Dymek 03/13/2012

## 2012-03-13 NOTE — Anesthesia Preprocedure Evaluation (Signed)
Anesthesia Evaluation  Patient identified by MRN, date of birth, ID band Patient awake    Reviewed: Allergy & Precautions, H&P , NPO status , Patient's Chart, lab work & pertinent test results  Airway Mallampati: I TM Distance: >3 FB Neck ROM: Full    Dental   Pulmonary          Cardiovascular + dysrhythmias + pacemaker     Neuro/Psych    GI/Hepatic   Endo/Other    Renal/GU      Musculoskeletal   Abdominal   Peds  Hematology   Anesthesia Other Findings   Reproductive/Obstetrics                           Anesthesia Physical Anesthesia Plan  ASA: III  Anesthesia Plan: MAC   Post-op Pain Management:    Induction: Intravenous  Airway Management Planned: Natural Airway  Additional Equipment:   Intra-op Plan:   Post-operative Plan:   Informed Consent: I have reviewed the patients History and Physical, chart, labs and discussed the procedure including the risks, benefits and alternatives for the proposed anesthesia with the patient or authorized representative who has indicated his/her understanding and acceptance.     Plan Discussed with: CRNA and Surgeon  Anesthesia Plan Comments:         Anesthesia Quick Evaluation

## 2012-03-13 NOTE — Progress Notes (Signed)
PULMONARY  / CRITICAL CARE MEDICINE  Name: Robin [name] MRN: [id number] DOB: 04/08/1928    LOS: 3  REFERRING PROVIDER:  Dr. Graciela Husbands  CHIEF COMPLAINT:  Bilateral Pneumothorax  BRIEF PATIENT DESCRIPTION: 79 y/o F with PMH of Narcolepsy, LBBB -rate related,  Mobitz 2 AV block with RBBB admitted on 1/24 for dual chamber pacemaker insertion.  Both sides attempted -post procedure, patient was noted to have mild chest pain. CXR demonstrated bilateral pneumothorax.   LINES / TUBES: 1/24 R CT x 2 >>> 1/24 L CT>>>  CULTURES: None  ANTIBIOTICS: None  SIGNIFICANT EVENTS:  1/24 - Admit for dual chamber pacer, bilateral PTX requiring CT x2  LEVEL OF CARE:  ICU PRIMARY SERVICE:  Cardiology CONSULTANTS:  PCCM CODE STATUS:  Full DIET:  Heart Healthy DVT Px:  SCD GI Px:  None   HISTORY OF PRESENT ILLNESS:  79 y/o F with PMH of Narcolepsy, LBBB -rate related, Mobitz 2 AV block with RBBB admitted on 1/24 for dual chamber pacemaker insertion. Both sides attempted -post procedure, patient was noted to have mild chest pain. CXR demonstrated bilateral pneumothorax.    INTERVAL HISTORY:  c/o pain at chest tube sites, otherwise, breathing is not labored. Has mild nausea, constipation. No chest pain, severe shortness of breath, abd pain, dysuria, hematuria.   VITAL SIGNS: Temp:  [98.2 F (36.8 C)-99 F (37.2 C)] 98.3 F (36.8 C) (01/26 0300) Pulse Rate:  [65-95] 68  (01/26 0700) Resp:  [0-25] 15  (01/26 0700) BP: (98-125)/(43-60) 101/46 mmHg (01/26 0700) SpO2:  [88 %-100 %] 98 % (01/26 0700)  PHYSICAL EXAMINATION: General:  Elderly female s/p bilateral CT placement Neuro:  AAOx4 pre-procedure, MAE HEENT:  Mm pink/moist, no jvd Cardiovascular:  s1s2 rrr, no m/r/g Lungs:  lungs bilaterally diminished Abdomen:  Round/soft, bsx4 active Musculoskeletal: no acute deform Skin:  Warm/dry, no edema   IMAGING:  Dg Chest Port 1 View (03/12/2012) - 1. Stable right chest tubes.   Moderate right side pneumothorax suspected, stable to slightly increased from earlier. 2.  Tiny left apical pneumothorax is stable with left chest tube in place. 3.  Lower lung volumes with increased bibasilar opacity.      MEDICATIONS: Infusions    . sodium chloride 10 mL/hr at 03/12/12 0800    Scheduled    . aspirin EC  81 mg Oral Daily  . bisacodyl  5 mg Oral BID  . docusate sodium  100 mg Oral BID  . feeding supplement  1 Container Oral TID BM  . flumazenil  0.2 mg Intravenous Once     ASSESSMENT / PLAN:  PULMONARY:  Lab 03/11/12 2023  PHART 7.361  PCO2ART 41.2  PO2ART 70.0*  HCO3 23.3  TCO2 25   A: 1. Bilateral Pneumothorax, iatrogenic - from pacemaker placement 1/24. Bilateral CT placed with right showing 20% residual, was air leak positive. 2. Mild LLL ATX  P: -CT  bilaterally to suction 20 cm, air leak present Bl, 20% residual pnthx on rt on CXR 1/26 -O2 to support sats >93% -hold abx, monitor fever curve & leukocytosis with LLL atx. -Incentive spirometry  CARDIOVASCULAR  Lab 03/11/12 1230 03/11/12 1000 03/10/12 1655  CKTOTAL -- -- --  CKMB -- -- --  TROPONINI <0.30 <0.30 <0.30  No results found for this basename: PROBNP:3 in the last 168 hours  A:  1) History of 2:1 AV block, s/p pacemaker implantation 1/24 - Troponins neg x 3 P: - Per cardiology (primary service).  RENAL:  Lab  03/12/12 1208 03/10/12 1655 03/10/12 1215  NA 132* 140 141  K 4.0 3.9 4.3  CL 97 102 105  CO2 25 26 24   BUN 15 9 12   CREATININE 0.79 0.71 0.70  GLUCOSE 111* 107* 90  MG -- 2.3 --  PHOS -- 3.1 --   Intake/Output Summary (Last 24 hours) at 03/13/12 0938 Last data filed at 03/13/12 0548  Gross per 24 hour  Intake   1000 ml  Output    795 ml  Net    205 ml   A: 1) Hyponatremia - mild this AM, possible hypovolemic hyponatremia. Good urine output. P: - Continue to monitor.  GASTROINTESTINAL  Lab 03/10/12 1655 03/10/12 1215  AST 17 21  ALT 10 12  ALKPHOS 97  98  BILITOT 0.3 0.3  PROT 7.1 7.0  ALBUMIN 3.8 3.9   A: 1) Constipation P: - Will add Dulcolax bid.  HEMATOLOGIC  Lab 03/12/12 1208 03/11/12 1230 03/10/12 1215  WBC 11.9* -- 7.4  HGB 11.5* -- 13.1  HCT 33.1* -- 38.6  MCV 89.5 -- 88.7  PLT 231 -- 283  APTT -- -- --  INR -- 1.02 --   A:  1) Acute normocytic anemia - likely 2/2 procedural blood loss. Mild.  2) History of vulvar cancer  P:  - Continue to monitor CBC, coags.  INFECTIOUS  Lab 03/12/12 1208 03/10/12 1215  WBC 11.9* 7.4  NEUTROABS -- 4.9  LATICACIDVEN -- --  PROCALCITON -- --   A:  1) Leukocytosis - likely stress demargination. UA and CXR unremarkable for infectious source. P:  - Continue to monitor for any s/s of infection.   ENDOCRINE / RHEUMATOLOGIC  Lab 03/13/12 0805 03/12/12 0823 03/11/12 0730  GLUCAP 86 104* 77   Lab Results  Component Value Date   TSH 2.313 03/11/2012    A:  1) Mild Hyperglycemia P:  - Continue to monitor CBG.   NEUROLOGIC / PSYCHIATRIC  A:  No acute issues. P:  None   CLINICAL SUMMARY: 79 y/o F with PMH of Narcolepsy, LBBB -rate related,  Mobitz 2 AV block with RBBB admitted on 1/24 for dual chamber pacemaker insertion.  Both sides attempted -post procedure, patient was noted to have mild chest pain. CXR demonstrated bilateral pneumothorax, breathing well, PTX stable from prior.  Updated daughter in detail  Signed: Maryjean Ka, Internal Medicine Resident Pager: [phone] (7AM-5PM)  Oretha Milch.  03/13/2012, 9:27 AM

## 2012-03-13 NOTE — Brief Op Note (Signed)
03/10/2012 - 03/13/2012  3:45 PM  PATIENT:  [name]  79 y.o. female  PRE-OPERATIVE DIAGNOSIS:  right chest tube occlusion  POST-OPERATIVE DIAGNOSIS:  same  PROCEDURE:  Procedure(s) (LRB) with comments: CHEST TUBE INSERTION (Right)  SURGEON:  Surgeon(s) and Role:    Kerin Perna, MD - Primary  PHYSICIAN ASSISTANT: 0  ASSISTANTS: none   ANESTHESIA:   local and MAC  EBL:  Total I/O In: 160 [P.O.:160] Out: 285 [Urine:275; Chest Tube:10]  BLOOD ADMINISTERED:none  DRAINS: 1 75F Chest Tube(s) in the R pleural space   LOCAL MEDICATIONS USED: 9ml 1 % lidocaine  SPECIMEN:  No Specimen  DISPOSITION OF SPECIMEN:  N/A  COUNTS:  YES  TOURNIQUET:  * No tourniquets in log *  DICTATION: .Dragon Dictation  PLAN OF CARE: Admit to inpatient   PATIENT DISPOSITION:  PACU - hemodynamically stable.   Delay start of Pharmacological VTE agent (>24hrs) due to surgical blood loss or risk of bleeding: yes

## 2012-03-13 NOTE — Progress Notes (Addendum)
Subjective:  Quite uncomfortable. R mid lung region discomfort. Worse with deep breath.   Objective:  Vital Signs in the last 24 hours: Temp:  [98.2 F (36.8 C)-99 F (37.2 C)] 98.3 F (36.8 C) (01/26 0300) Pulse Rate:  [65-99] 68  (01/26 0700) Resp:  [0-28] 15  (01/26 0700) BP: (98-125)/(43-60) 101/46 mmHg (01/26 0700) SpO2:  [88 %-100 %] 98 % (01/26 0700)  Intake/Output from previous day: 01/25 0701 - 01/26 0700 In: 1440 [P.O.:1380; I.V.:60] Out: 835 [Urine:725; Chest Tube:110]   Physical Exam: General: Well nourished, appears uncomfortable sitting up on side of bed. Head:  Normocephalic and atraumatic. Lungs: Decreased BS RUL, difficultly with deep inspiration due to discomfort. Chest tubes.  Heart: Normal S1 and S2.  No murmur, rubs or gallops. Pacer dressed. No hematoma. Abdomen: soft, non-tender, positive bowel sounds. Extremities: No clubbing or cyanosis. No edema. Neurologic: Alert and oriented x 3.    Lab Results:  [facility] 03/12/12 1208 03/10/12 1215  WBC 11.9* 7.4  HGB 11.5* 13.1  PLT 231 283    Basename 03/12/12 1208 03/10/12 1655  NA 132* 140  K 4.0 3.9  CL 97 102  CO2 25 26  GLUCOSE 111* 107*  BUN 15 9  CREATININE 0.79 0.71    Basename 03/11/12 1230 03/11/12 1000  TROPONINI <0.30 <0.30   Hepatic Function Panel  Basename 03/10/12 1655  PROT 7.1  ALBUMIN 3.8  AST 17  ALT 10  ALKPHOS 97  BILITOT 0.3  BILIDIR --  IBILI --    Imaging: Dg Chest Port 1 View  03/12/2012  *RADIOLOGY REPORT*  Clinical Data: 79 year old female with pneumothorax.  PORTABLE CHEST - 1 VIEW  Comparison: 0245 hours the same day and earlier.  Findings: AP portable upright view 1331 hours.  One large caliber chest tube and one pigtail type chest tube remain in place. Moderate sized right pneumothorax visible at the apex is stable to perhaps slightly increased. Lung volumes are lower.  Stable cardiac size and mediastinal contours. Left chest tube remains in place.  Tiny  left apical pneumothorax is stable. Interval increased bibasilar opacity.  IMPRESSION: 1.  Stable right chest tubes.  Moderate right side pneumothorax suspected, stable to slightly increased from earlier. 2.  Tiny left apical pneumothorax is stable with left chest tube in place. 3.  Lower lung volumes with increased bibasilar opacity.   Original Report Authenticated By: Erskine Speed, M.D.    Dg Chest Port 1 View  03/12/2012  *RADIOLOGY REPORT*  Clinical Data: Follow up respiratory failure.  PORTABLE CHEST - 1 VIEW  Comparison: Chest radiograph performed earlier today at 01:32 a.m.  Findings: There has been interval re-expansion of the right lung, status post placement of a right basilar chest drain.  A residual small right-sided pneumothorax is seen, measuring approximately 20% of right lung volume.  A tiny left apical pneumothorax is again noted.  The prior right-sided chest tube has been retracted slightly.  Mild bilateral atelectasis is seen.  The left lung apical chest tube is unchanged in appearance.  The cardiomediastinal silhouette is borderline normal in size; calcification is noted at the aortic arch.  The pacemaker is seen overlying the right chest wall, with leads ending overlying the right atrium and right ventricle no acute osseous abnormalities are seen.  Scattered soft tissue air at the chest wall, more prominent on the right, appears grossly stable.  IMPRESSION:  1.  Interval re-expansion of the right lung, status post placement of a right basilar chest  drain.  Residual small right-sided pneumothorax noted, measuring 20% of right lung volume. 2.  Tiny left apical pneumothorax again noted. 3.  Mild bilateral atelectasis seen.   Original Report Authenticated By: Tonia Ghent, M.D.    Dg Chest Port 1 View  03/12/2012  *RADIOLOGY REPORT*  Clinical Data: Shortness of breath.  PORTABLE CHEST - 1 VIEW  Comparison: Several prior exams most recent examination 03/11/2012 2344 hours.  Findings: There has  been a significant change since prior examination with complete collapse of the right lung.  The large right-sided pneumothorax may be under tension causing mediastinal shift to the left.  The right-sided chest tube side hole projects over the mid to lower thorax.  On this single projection this cannot be definitively confirmed as within the chest.  A hole within the right lung cannot be excluded given the recurrent right- sided pneumothorax.  Left-sided chest tube is in place.  Small left apical pneumothorax.  Pulmonary vascular congestion in the left lung with left base subsegmental atelectasis/scarring.  The  Cardiomegaly.  Pacemaker in place.  Mildly tortuous aorta. Bilateral subcutaneous emphysema.  IMPRESSION: There has been a significant change since prior examination with complete collapse of the right lung.  The large right-sided pneumothorax may be under tension causing mediastinal shift to the left.  The right-sided chest tube side hole projects over the mid to lower thorax.  On this single projection this cannot be definitively confirmed as within the chest.  A hole within the right lung cannot be excluded given the recurrent right-sided pneumothorax.  Left-sided chest tube is in place.  Small left apical pneumothorax.  Critical Value/emergent results were called by telephone at the time of interpretation on 03/12/2012 at 1:44 a.m. to [facility] patient's nurse, who verbally acknowledged these results.  Phone conversation with Dr. Emmaline Kluver 03/12/2012 1:55 a.m.   Original Report Authenticated By: Lacy Duverney, M.D.    Dg Chest Port 1 View  03/12/2012  *RADIOLOGY REPORT*  Clinical Data: Reinsertion chest tube.  PORTABLE CHEST - 1 VIEW  Comparison: 03/11/2012 9:53 p.m.  Findings: Right-sided chest tube is in place.  There has been a decrease in the size of the right-sided pneumothorax with a 15 - 20% pneumothorax remaining.  Left-sided chest tube is in place.  Decrease in size of left apical pneumothorax and a  tiny in size.  Bilateral subcutaneous emphysema.  Re-expansion of the right lung with residual bibasilar atelectasis noted.  Mediastinal shift to the left without significant change.  Calcified aorta.  Sequential pacemaker enters from the right with leads unchanged in position.  IMPRESSION: Decrease in size of right-sided pneumothorax and re-expansion of right lower lobe.  15 - 20%  right-sided apical pneumothorax remains.  There may be a small inferior component.  Decrease in size of left-sided pneumothorax now tiny in size.  Critical Value/emergent results were called by telephone at the time of interpretation on 03/12/2012 at 12:08 p.m. to [facility] the patient's nurse, who verbally acknowledged these results.   Original Report Authenticated By: Lacy Duverney, M.D.    Dg Chest Port 1 View  03/11/2012  *RADIOLOGY REPORT*  Clinical Data: Follow up right-sided tension pneumothorax.  PORTABLE CHEST - 1 VIEW  Comparison: Chest radiograph performed earlier today at 08:18 p.m.  Findings: The right-sided pneumothorax has increased significantly in size, now measuring approximately 65% of right lung volume, with partial collapse of the right lung noted at the right lung base. Patchy left-sided atelectasis is again seen.  A tiny residual left- sided  pneumothorax is noted.  The patient's bilateral chest tubes are unchanged in appearance.  There is mild leftward mediastinal shift, raising concern for tension pneumothorax.  The cardiomediastinal silhouette is otherwise grossly unremarkable.  A pacemaker is seen overlying the right chest wall, with leads ending overlying the right atrium and right ventricle.  No acute osseous abnormalities are seen.  Soft tissue air is noted along the chest wall bilaterally, much more prominent on the right.  This is grossly stable from the prior chest radiograph.  IMPRESSION:  1.  Significant interval increase in size of right-sided pneumothorax, now measuring 65% of right lung volume, with  partial collapse of the right lung.  Mild leftward mediastinal shift raises concern for tension pneumothorax.  Would turn off suction, reposition the right-sided chest tube and turn on suction; the chest tube may currently abut the right lung. 2.  Mild interval decrease in size of tiny left apical pneumothorax. 3.  Stable appearance with regard to soft tissue air along the chest wall bilaterally, much more prominent on the right.  These results were called by telephone on 03/11/2012 at 10:09 p.m. to Nursing on [id number], who verbally acknowledged these results.   Original Report Authenticated By: Tonia Ghent, M.D.    [facility] 1 View  03/11/2012  *RADIOLOGY REPORT*  Clinical Data: Reevaluate pneumothorax.  PORTABLE CHEST - 1 VIEW  Comparison: 03/11/2012 5:43 p.m.  03/10/2012  Findings: Bilateral chest tubes are in place.  Left apical pneumothorax without significant change.  Increased amount of left chest wall subcutaneous emphysema.  Increase in size of right-sided pneumothorax now approximately 25%. This appears across midline.  Degree of midline shift to the left without significant change as may be expected with tension pneumothorax.  Increase in amount of right-sided subcutaneous emphysema.  Dual lead pacemaker is in place entering from the right with leads unchanged position.  Mild cardiomegaly.  Central pulmonary vascular prominence.  Calcified tortuous aorta.  IMPRESSION: Increase in size of right-sided pneumothorax now approximately 25%. Degree of tension not entirely excluded.  No significant change left apical pneumothorax.  Increase in bilateral chest subcutaneous emphysema.  Critical Value/emergent results were called by telephone at the time of interpretation on 03/11/2012 at 9:14 p.m. to [facility] patient's nurse., who verbally acknowledged these results.   Original Report Authenticated By: Lacy Duverney, M.D.    Dg Chest Port 1 View  03/11/2012  *RADIOLOGY REPORT*  Clinical Data: Bilateral chest  tube placement  PORTABLE CHEST - 1 VIEW  Comparison: Prior films same day  Findings: Cardiomediastinal silhouette is stable.  Dual lead cardiac pacemaker is unchanged in position.  Bilateral chest tube in place noted.  Small residual right apical pneumothorax.  There is approximately 25% -30 percent left upper pneumothorax.  There is peripheral and basilar atelectasis of the left lung.  IMPRESSION:  Dual lead cardiac pacemaker is unchanged in position.  Bilateral chest tube in place noted.  Small residual right apical pneumothorax.  There is approximately 25% -30 percent left upper pneumothorax.  There is peripheral and basilar atelectasis of the left lung.   Original Report Authenticated By: Natasha Mead, M.D.    Dg Chest Port 1 View  03/11/2012  *RADIOLOGY REPORT*  Clinical Data: Chest pain.  Pacemaker insertion.  PORTABLE CHEST - 1 VIEW  Comparison: Chest x-ray 80 03/10/2012.  Findings: The permanent right-sided pacemaker and right atrial and particular wires are in good position. There are bilateral 25% pneumothoraces.  Mild compressive lower lobe atelectasis and underlying emphysematous changes.  IMPRESSION:  1.  Pacer wires in good position. 2.  Bilateral 25% pneumothoraces.  Findings called directly to the cath lab.   Original Report Authenticated By: Rudie Meyer, M.D.    Personally viewed.   Telemetry: Paced rhythm Personally viewed.   Cardiac Studies:  EF normal  Assessment/Plan:  Principal Problem:  *AV heart block Active Problems:  Acute on chronic diastolic heart failure  Bilateral pneumothoraces  79 year old with bilateral pneumothoraces following pacemaker implantation secondary to 2:1 AV block.   1. Pneumothorax - CXR demonstrates moderate R sided pneumothorax. Left very small/essentially resolved. Reviewed CCM and TCTS note. Discomfort noted. Discussed with son and patient. They understand that there may need to be further manipulation to improve resolution of PTX. I ordered AM CXR.    2. Pacer - functioning well.  Family would like soft diet. There has been difficultly finding her dentures.   Appreciate CCM/TCTS care.     SKAINS, MARK 03/13/2012, 8:44 AM

## 2012-03-13 NOTE — Progress Notes (Signed)
The patient was examined and preop studies reviewed. There has been no change from the prior exam and the patient is ready for surgery.  pLAN PLACEMENT OF right CHEST TUBE FOR PERSISTENT R PNEUMOTHORAX ON  E Moffa  I have discussed the procedure in detail with patient and family including potential complications of bleeding,pneumothorax persistent, intubation.

## 2012-03-13 NOTE — Preoperative (Signed)
Beta Blockers   Reason not to administer Beta Blockers:Not Applicable 

## 2012-03-13 NOTE — Progress Notes (Signed)
PULMONARY  / CRITICAL CARE MEDICINE  Name: Robin [name] MRN: [id number] DOB: 1928-10-06    LOS: 4  REFERRING PROVIDER:  Dr. Graciela Husbands  CHIEF COMPLAINT:  Bilateral Pneumothorax  BRIEF PATIENT DESCRIPTION: 79 y/o F with PMH of Narcolepsy, LBBB -rate related,  Mobitz 2 AV block with RBBB admitted on 1/24 for dual chamber pacemaker insertion.  Both sides attempted -post procedure, patient was noted to have mild chest pain. CXR demonstrated bilateral pneumothorax. Right CT replaced for larger on 1/26 2/2 persistent PTX.  LINES / TUBES: 1/24 R CT x 2 >> 1/26, replaced 1/26 (TCTS) >>> 1/24 L CT>>>  CULTURES: 1/26 MRSA Nasal swab PCR >> neg  ANTIBIOTICS: None  SIGNIFICANT EVENTS:  1/24 - Admit for dual chamber pacer, bilateral PTX requiring CT x2 1/26 - Persistent BL PTX with right 20% PTX despite pigtail catheter. 1/26 - Right chest tube replacement (with larger size)   LEVEL OF CARE:  ICU PRIMARY SERVICE:  Cardiology CONSULTANTS:  PCCM CODE STATUS:  Full DIET:  Heart Healthy DVT Px:  SCD GI Px:  None   HISTORY OF PRESENT ILLNESS:  78 y/o F with PMH of Narcolepsy, LBBB -rate related, Mobitz 2 AV block with RBBB admitted on 1/24 for dual chamber pacemaker insertion. Both sides attempted -post procedure, patient was noted to have mild chest pain. CXR demonstrated bilateral pneumothorax.    INTERVAL HISTORY:  States breathing slightly improved from prior. Less pain from prior.   VITAL SIGNS: Temp:  [97.5 F (36.4 C)-98.5 F (36.9 C)] 97.8 F (36.6 C) (01/27 0400) Pulse Rate:  [69-106] 73  (01/27 0740) Resp:  [7-30] 8  (01/27 0740) BP: (99-160)/(41-72) 111/51 mmHg (01/27 0600) SpO2:  [92 %-100 %] 96 % (01/27 0740) Weight:  [141 lb 8.6 oz (64.2 kg)] 141 lb 8.6 oz (64.2 kg) (01/27 0459)  PHYSICAL EXAMINATION: General:  Elderly female s/p bilateral CT placement Neuro:  AAOx4 pre-procedure, MAE HEENT:  Mm pink/moist, no jvd Cardiovascular:  s1s2 rrr, no  m/r/g Lungs:  lungs bilaterally diminished. Mild end insp wheezing., BL air leak + Abdomen:  Round/soft, bsx4 active Musculoskeletal: no acute deform Skin:  Warm/dry, no edema   IMAGING:  pCXR (03/14/2012) -     BL small apical pnthx  MEDICATIONS: Infusions    . sodium chloride 10 mL/hr at 03/12/12 0800  . 0.9 % NaCl with KCl 20 mEq / L 20 mL/hr at 03/13/12 2148    Scheduled    . acetaminophen  1,000 mg Intravenous Q6H  . aspirin EC  81 mg Oral Daily  . bisacodyl  5 mg Oral BID  . docusate sodium  100 mg Oral BID  . enoxaparin  40 mg Subcutaneous Q24H  . feeding supplement  1 Container Oral TID BM     ASSESSMENT / PLAN:  PULMONARY:  Lab 03/11/12 2023  PHART 7.361  PCO2ART 41.2  PO2ART 70.0*  HCO3 23.3  TCO2 25   A: 1. Bilateral Pneumothorax, iatrogenic - from pacemaker placement 1/24. Bilateral CT placed with right showing 20% residual, was air leak positive. Unresolving on right, s/p replacement with larger chest tube 1/26. Follow-up CXR pending. 2. Mild LLL ATX  P: - CT  bilaterally to suction 20 cm, air leak present Bl, 20% residual pnthx on rt on CXR 1/26 -  O2 to support sats >93% - Hold abx, monitor fever curve & leukocytosis with LLL atx. - Incentive spirometry -Painmanagement  CARDIOVASCULAR  Lab 03/11/12 1230 03/11/12 1000 03/10/12 1655  CKTOTAL -- -- --  CKMB -- -- --  TROPONINI <0.30 <0.30 <0.30  No results found for this basename: PROBNP:3 in the last 168 hours  A:  1) History of 2:1 AV block, s/p pacemaker implantation 1/24 - Troponins neg x 3 P: - Per cardiology (primary service).  RENAL:  Lab 03/14/12 0455 03/13/12 1350 03/12/12 1208 03/10/12 1655  NA 134* 137 132* --  K 3.8 4.7 4.0 --  CL 99 101 97 --  CO2 27 26 25  --  BUN 14 12 15  --  CREATININE 0.59 0.62 0.79 --  GLUCOSE 130* 101* 111* --  MG 1.9 -- -- 2.3  PHOS 3.2 -- -- 3.1    Intake/Output Summary (Last 24 hours) at 03/14/12 0741 Last data filed at 03/14/12 0600   Gross per 24 hour  Intake    924 ml  Output   1207 ml  Net   -283 ml   A: 1) Hyponatremia - mild this AM, corrects to her BG. Good urine output. P: - Continue to monitor.  GASTROINTESTINAL  Lab 03/13/12 1350 03/10/12 1655 03/10/12 1215  AST 27 17 21   ALT 22 10 12   ALKPHOS 75 97 98  BILITOT 0.4 0.3 0.3  PROT 6.5 7.1 7.0  ALBUMIN 3.3* 3.8 3.9   A: 1) Constipation P: - Will add Dulcolax bid. - Add Miralax.  HEMATOLOGIC  Lab 03/14/12 0455 03/13/12 1350 03/12/12 1208 03/11/12 1230  WBC 7.9 9.3 11.9* --  HGB 10.4* 11.5* 11.5* --  HCT 30.3* 33.0* 33.1* --  MCV 88.9 89.4 89.5 --  PLT 205 205 231 --  APTT -- 27 -- --  INR -- 1.01 -- 1.02   A:  1) Acute normocytic anemia - likely 2/2 procedural blood loss. Mild. Small down-trend overnight, likely 2/2 procedure 1/26. 2) History of vulvar cancer  P:  - Continue to monitor CBC, coags.  INFECTIOUS  Lab 03/14/12 0455 03/13/12 1350 03/12/12 1208 03/10/12 1215  WBC 7.9 9.3 11.9* --  NEUTROABS -- -- -- 4.9  LATICACIDVEN -- -- -- --  PROCALCITON -- -- -- --   A:  1) Leukocytosis - Resolved. Likely stress demargination. UA and CXR unremarkable for infectious source. P:  - Continue to monitor for any s/s of infection.  ENDOCRINE / RHEUMATOLOGIC  Lab 03/13/12 0805 03/12/12 0823 03/11/12 0730  GLUCAP 86 104* 77   Lab Results  Component Value Date   TSH 2.313 03/11/2012    A:  1) Mild Hyperglycemia P:  - Continue to monitor CBG.   CLINICAL SUMMARY: 79 y/o F with PMH of Narcolepsy, LBBB -rate related,  Mobitz 2 AV block with RBBB admitted on 1/24 for dual chamber pacemaker insertion.  Both sides attempted -post procedure, patient was noted to have mild chest pain. CXR demonstrated bilateral pneumothorax,  resolving. Waiting on air leak to resolve   Signed: Johnette Abraham, Roma Schanz, Internal Medicine Resident Pager: [phone] (7AM-5PM)  Care during the described time interval was provided by me and/or  other providers on the critical care team.  I have reviewed this patient's available data, including medical history, events of note, physical examination and test results as part of my evaluation  Khilynn Borntreger V.

## 2012-03-13 NOTE — Transfer of Care (Signed)
Immediate Anesthesia Transfer of Care Note  Patient: Robin [name]  Procedure(s) Performed: Procedure(s) (LRB) with comments: CHEST TUBE INSERTION (Right)  Patient Location: PACU  Anesthesia Type:MAC  Level of Consciousness: awake, alert  and oriented  Airway & Oxygen Therapy: Patient Spontanous Breathing and Patient connected to face mask oxygen  Post-op Assessment: Report given to PACU RN, Post -op Vital signs reviewed and stable and Patient moving all extremities  Post vital signs: Reviewed and stable  Complications: No apparent anesthesia complications

## 2012-03-14 ENCOUNTER — Inpatient Hospital Stay (HOSPITAL_COMMUNITY): Payer: Medicare Other

## 2012-03-14 ENCOUNTER — Encounter (HOSPITAL_COMMUNITY): Payer: Self-pay | Admitting: Cardiothoracic Surgery

## 2012-03-14 LAB — CBC
HCT: 30.3 % — ABNORMAL LOW (ref 36.0–46.0)
Hemoglobin: 10.4 g/dL — ABNORMAL LOW (ref 12.0–15.0)
MCH: 30.5 pg (ref 26.0–34.0)
MCHC: 34.3 g/dL (ref 30.0–36.0)
MCV: 88.9 fL (ref 78.0–100.0)
Platelets: 205 10*3/uL (ref 150–400)
RBC: 3.41 MIL/uL — ABNORMAL LOW (ref 3.87–5.11)
RDW: 12.8 % (ref 11.5–15.5)
WBC: 7.9 10*3/uL (ref 4.0–10.5)

## 2012-03-14 LAB — BASIC METABOLIC PANEL
BUN: 14 mg/dL (ref 6–23)
CO2: 27 mEq/L (ref 19–32)
Calcium: 8.4 mg/dL (ref 8.4–10.5)
Chloride: 99 mEq/L (ref 96–112)
Creatinine, Ser: 0.59 mg/dL (ref 0.50–1.10)
GFR calc Af Amer: >90 mL/min (ref >90)
GFR calc non Af Amer: 84 mL/min — ABNORMAL LOW (ref >90)
Glucose, Bld: 130 mg/dL — ABNORMAL HIGH (ref 70–99)
Potassium: 3.8 mEq/L (ref 3.5–5.1)
Sodium: 134 mEq/L — ABNORMAL LOW (ref 135–145)

## 2012-03-14 LAB — PHOSPHORUS: Phosphorus: 3.2 mg/dL (ref 2.3–4.6)

## 2012-03-14 MED ORDER — POLYETHYLENE GLYCOL 3350 17 G PO PACK
17.0000 g | PACK | Freq: Two times a day (BID) | ORAL | Status: DC
  Administered 2012-03-14: 17 g via ORAL
  Filled 2012-03-14 (×6): qty 1

## 2012-03-14 NOTE — Progress Notes (Signed)
Pt c/o tingling and numbness on left side of face. Pt assessed and no neurological deficiencies noted with no change in facial appearance. Pt assessed by charge RN and Priscella Mann, DO notified. DO on unit. Pt states "it feels like its cold." Orders for neuro checks received. Will cont to monitor closely. Family in room and upated.

## 2012-03-14 NOTE — Progress Notes (Signed)
PULMONARY  / CRITICAL CARE MEDICINE  Name: Robin [name] MRN: [id number] DOB: March 15, 1928    LOS: 5  REFERRING PROVIDER:  Dr. Graciela Husbands  CHIEF COMPLAINT:  Bilateral Pneumothorax  BRIEF PATIENT DESCRIPTION: 79 y/o F with PMH of Narcolepsy, LBBB -rate related,  Mobitz 2 AV block with RBBB admitted on 1/24 for dual chamber pacemaker insertion.  Both sides attempted -post procedure, patient was noted to have mild chest pain. CXR demonstrated bilateral pneumothorax. Right CT replaced for larger on 1/26 2/2 persistent PTX.  LINES / TUBES: 1/24 R CT x 2 >> 1/26, replaced 1/26 (TCTS) >>> 1/24 L CT >>>  CULTURES: 1/26 MRSA Nasal swab PCR >> neg  ANTIBIOTICS: None  SIGNIFICANT EVENTS:  1/24 - Admit for dual chamber pacer, bilateral PTX requiring CT x2 1/26 - Persistent BL PTX with right 20% PTX despite pigtail catheter. 1/26 - Right chest tube replacement (with larger size) 1/27 - Improving PTX,    LEVEL OF CARE:  SDU PRIMARY SERVICE:  Cardiology CONSULTANTS:  PCCM CODE STATUS:  Full DIET:  Heart Healthy DVT Px:  Lovenox GI Px:  None   HISTORY OF PRESENT ILLNESS:  79 y/o F with PMH of Narcolepsy, LBBB -rate related, Mobitz 2 AV block with RBBB admitted on 1/24 for dual chamber pacemaker insertion. Both sides attempted -post procedure, patient was noted to have mild chest pain. CXR demonstrated bilateral pneumothorax.    INTERVAL HISTORY:  Patient feeling better this morning, however states that she had significant pain from her right chest tube overnight, but somewhat was refractory to the pain medication. Otherwise, breathing improved. Mild nausea. Still no bowel movement.  Interval events: - Had tingling of her face and eye pain yesterday, CT head ordered and was negative for acute findings.   VITAL SIGNS: Temp:  [98.5 F (36.9 C)-98.7 F (37.1 C)] 98.6 F (37 C) (01/28 0400) Pulse Rate:  [66-97] 72  (01/28 0600) Resp:  [2-22] 15  (01/28 0600) BP:  (114-147)/(41-72) 116/41 mmHg (01/28 0600) SpO2:  [89 %-100 %] 97 % (01/28 0600) Weight:  [141 lb 5 oz (64.1 kg)] 141 lb 5 oz (64.1 kg) (01/28 0500)  PHYSICAL EXAMINATION: General:  Elderly female s/p bilateral CT placement Neuro:  AAOx4 pre-procedure, MAE HEENT:  Mm pink/moist, no jvd Cardiovascular:  s1s2 rrr, no m/r/g Lungs:  lungs bilaterally diminished, Bl air leak +, lt on coughing Abdomen:  Round/soft, bsx4 active Musculoskeletal: no acute deform Skin:  Warm/dry, no edema   IMAGING:  pCXR (03/14/2012) -     BL small apical pnthx  CT head (1/287/2014) - No acute intracranial findings or skull fracture.  MEDICATIONS: Infusions    . sodium chloride 10 mL/hr at 03/12/12 0800  . 0.9 % NaCl with KCl 20 mEq / L 20 mL/hr at 03/13/12 2148    Scheduled    . aspirin EC  81 mg Oral Daily  . docusate sodium  100 mg Oral BID  . enoxaparin  40 mg Subcutaneous Q24H  . feeding supplement  1 Container Oral TID BM  . polyethylene glycol  17 g Oral BID  . senna  2 tablet Oral Daily     ASSESSMENT / PLAN:  PULMONARY:  Lab 03/11/12 2023  PHART 7.361  PCO2ART 41.2  PO2ART 70.0*  HCO3 23.3  TCO2 25   A: 1. Bilateral Pneumothorax, iatrogenic - from pacemaker placement 1/24. Bilateral CT placed - Unresolving on right, s/p replacement with larger chest tube 1/26.  2. Mild LLL ATX  P: -  rt CT to suction 20 cm, Lt to water seal, air leak present Bl,  - O2 to support sats >93% - Incentive spirometry - Pain management - oxycodone effective.  CARDIOVASCULAR  Lab 03/11/12 1230 03/11/12 1000 03/10/12 1655  CKTOTAL -- -- --  CKMB -- -- --  TROPONINI <0.30 <0.30 <0.30  No results found for this basename: PROBNP:3 in the last 168 hours  A:  1) History of 2:1 AV block, s/p pacemaker implantation 1/24  P: - Per cardiology (primary service).  RENAL:  Lab 03/15/12 0518 03/14/12 0455 03/13/12 1350 03/10/12 1655  NA 141 134* 137 --  K 3.9 3.8 4.7 --  CL 104 99 101 --  CO2  27 27 26  --  BUN 11 14 12  --  CREATININE 0.53 0.59 0.62 --  GLUCOSE 106* 130* 101* --  MG 2.0 1.9 -- 2.3  PHOS 3.6 3.2 -- 3.1    Intake/Output Summary (Last 24 hours) at 03/15/12 0658 Last data filed at 03/15/12 0600  Gross per 24 hour  Intake   1550 ml  Output   1535 ml  Net     15 ml   A: 1) Hyponatremia - Resolved. Good urine output. P: - Continue to monitor.  GASTROINTESTINAL  Lab 03/15/12 0518 03/13/12 1350 03/10/12 1655  AST 18 27 17   ALT 16 22 10   ALKPHOS 71 75 97  BILITOT 0.2* 0.4 0.3  PROT 6.1 6.5 7.1  ALBUMIN 2.8* 3.3* 3.8   A: 1) Constipation P: - Senna, Colace, Miralax - Add milk of magnesia  HEMATOLOGIC  Lab 03/15/12 0518 03/14/12 0455 03/13/12 1350 03/11/12 1230  WBC 7.2 7.9 9.3 --  HGB 10.9* 10.4* 11.5* --  HCT 30.8* 30.3* 33.0* --  MCV 87.7 88.9 89.4 --  PLT 214 205 205 --  APTT -- -- 27 --  INR -- -- 1.01 1.02   A:  1) Acute normocytic anemia - likely 2/2 procedural blood loss. Mild. Small down-trend 1/27, likely 2/2 procedure 1/26. But, stable since that time 2) History of vulvar cancer  P:  - Continue to monitor CBC, coags.  INFECTIOUS  Lab 03/15/12 0518 03/14/12 0455 03/13/12 1350 03/10/12 1215  WBC 7.2 7.9 9.3 --  NEUTROABS -- -- -- 4.9  LATICACIDVEN -- -- -- --  PROCALCITON -- -- -- --   A:  1) Leukocytosis - Resolved. Likely stress demargination. UA and CXR unremarkable for infectious source. P:  - Continue to monitor for any s/s of infection.  ENDOCRINE / RHEUMATOLOGIC  Lab 03/13/12 0805 03/12/12 0823 03/11/12 0730  GLUCAP 86 104* 77   Lab Results  Component Value Date   TSH 2.313 03/11/2012    A:  1) Mild Hyperglycemia P:  - Continue to monitor CBG.   CLINICAL SUMMARY: 79 y/o F with PMH of Narcolepsy, LBBB -rate related,  Mobitz 2 AV block with RBBB admitted on 1/24 for dual chamber pacemaker insertion -bilateral pneumothorax, resolving.  OK to tr to SDU   Signed: Johnette Abraham, D.OConsuello Bossier,  Internal Medicine Resident Pager: [phone] (7AM-5PM)  Oretha Milch.

## 2012-03-14 NOTE — Progress Notes (Signed)
Clinical Social Work Department BRIEF PSYCHOSOCIAL ASSESSMENT 03/14/2012  Patient:  Robin [name], Robin [name]     Account Number:  [id number]     Admit date:  03/10/2012  Clinical Social Worker:  Dennison Bulla  Date/Time:  03/14/2012 11:00 AM  Referred by:  Physician  Date Referred:  03/14/2012 Referred for  Other - See comment   Other Referral:   Interview type:  Patient Other interview type:    PSYCHOSOCIAL DATA Living Status:  ALONE Admitted from facility:   Level of care:   Primary support name:  Angelique Blonder Primary support relationship to patient:  CHILD, ADULT Degree of support available:   Strong    CURRENT CONCERNS Current Concerns  Financial Resources   Other Concerns:   Patient has concerns about hospital bills    SOCIAL WORK ASSESSMENT / PLAN CSW received referral to assist with financial concerns. CSW reviewed chart and met with patient and dtr at bedside. Patient agreeable to dtr involvement.    CSW introduced myself and explained role. Patient reports that she is worried about hospital bills. Patient explained that she owed [facility] over $23,000 after staying there for only 1 day. Patient reports she lives on fixed income and she is unable to pay large bills. Patient reports that collectors were calling her house daily and she felt she had to pay off all her bills. Dtr tried to reassure patient to not worry about the medical bills but patient wanted to speak about it.    CSW referred patient to Department of Social Services to apply for Medicaid. Patient feels she would be eligible and reports dtr can assist with completing application. Dtr agreeable as well. CSW also provided patient with flyer on "Information about paying your bill".    Dtr spoke with CSW in hallway and reports she is upset with care. Patient's glasses and dentures have been lost and dtr feels MD made a mistake to cause more medical problems. Dtr insisting that patient not have to pay  any medical bills due to mistake that she believes occurred. CSW called patient experience and referred patient to office for further follow up.    CSW is signing off but available if further needs arise.   Assessment/plan status:  No Further Intervention Required Other assessment/ plan:   Information/referral to community resources:   "Information about paying your bill" flyer  Referral to Department of Social Services    PATIENT'S/FAMILY'S RESPONSE TO PLAN OF CARE: Patient alert and oriented. Patient agreeable to assessment and thanked CSW for time. Patient reports she will follow up with referrals. Dtr engaged and worried about patient. Dtr wants follow up from patient experience.

## 2012-03-14 NOTE — Progress Notes (Signed)
Electrophysiology Patient Name: Robin [name]      SUBJECTIVE:  Travails of the weekend reviewed .  Unfortunate complications of my pacer placement Incredibly gracious  Much less pain  Still leaking   Past Medical History  Diagnosis Date  . LBBB (left bundle branch block)-rate related   . Narcolepsy   . Vulva cancer   . Mobitz type 2 second degree AV block     2:1  with RBBB    PHYSICAL EXAM Filed Vitals:   03/14/12 0400 03/14/12 0459 03/14/12 0500 03/14/12 0600  BP: 126/56  113/45 111/51  Pulse: 81  76 71  Temp: 97.8 F (36.6 C)     TempSrc: Oral     Resp: 19  17 17   Height:      Weight:  141 lb 8.6 oz (64.2 kg)    SpO2: 95%  98% 98%    Well developed and nourished in no acute distress HENT normal Neck supple with JVP-flat Clear anteriorly;; chest tubes  in Regular rate and rhythm,   Abd-soft with active BS No Clubbing cyanosis edema SCDs in place Skin-warm and dry A & Oriented  Grossly normal sensory and motor function  TELEMETRY: Reviewed telemetry pt in  paced:    Intake/Output Summary (Last 24 hours) at 03/14/12 0739 Last data filed at 03/14/12 0600  Gross per 24 hour  Intake    924 ml  Output   1207 ml  Net   -283 ml    LABS: Basic Metabolic Panel:  Lab 03/14/12 4098 03/13/12 1350 03/12/12 1208 03/10/12 1655 03/10/12 1215  NA 134* 137 132* 140 141  K 3.8 4.7 4.0 3.9 4.3  CL 99 101 97 102 105  CO2 27 26 25 26 24   GLUCOSE 130* 101* 111* 107* 90  BUN 14 12 15 9 12   CREATININE 0.59 0.62 0.79 0.71 0.70  CALCIUM 8.4 9.2 -- -- --  MG 1.9 -- -- 2.3 --  PHOS 3.2 -- -- 3.1 --   Cardiac Enzymes:  Basename 03/11/12 1230 03/11/12 1000  CKTOTAL -- --  CKMB -- --  CKMBINDEX -- --  TROPONINI <0.30 <0.30   CBC:  Lab 03/14/12 0455 03/13/12 1350 03/12/12 1208 03/10/12 1215  WBC 7.9 9.3 11.9* 7.4  NEUTROABS -- -- -- 4.9  HGB 10.4* 11.5* 11.5* 13.1  HCT 30.3* 33.0* 33.1* 38.6  MCV 88.9 89.4 89.5 88.7  PLT 205 205 231 283    PROTIME:  Basename 03/13/12 1350 03/11/12 1230  LABPROT 13.2 13.3  INR 1.01 1.02   Liver Function Tests:  Basename 03/13/12 1350  AST 27  ALT 22  ALKPHOS 75  BILITOT 0.4  PROT 6.5  ALBUMIN 3.3*   No results found for this basename: LIPASE:2,AMYLASE:2 in the last 72 hours BNP: BNP (last 3 results) No results found for this basename: PROBNP:3 in the last 8760 hours D-Dimer: No results found for this basename: DDIMER:2 in the last 72 hours Hemoglobin A1C: No results found for this basename: HGBA1C in the last 72 hours Fasting Lipid Panel: No results found for this basename: CHOL,HDL,LDLCALC,TRIG,CHOLHDL,LDLDIRECT in the last 72 hours Thyroid Function Tests:  Basename 03/11/12 1000  TSH 2.313  T4TOTAL --  T3FREE --  THYROIDAB --   Anemia Panel: No results found for this basename: VITAMINB12,FOLATE,FERRITIN,TIBC,IRON,RETICCTPCT in the last 72 hours   Device Interrogation: normal device function    ASSESSMENT AND PLAN:  Patient Active Hospital Problem List: AV heart block (03/10/2012)   Acute on chronic diastolic heart  failure (03/11/2012)   Bilateral pneumothoraces (03/11/2012)   rhtyhm stable post pacing Pneumothoraces " improved post revision X2 on Right Grateful for CCM and TCTS input Will follow    Signed, Sherryl Manges MD  03/14/2012

## 2012-03-14 NOTE — Progress Notes (Addendum)
Patient Name: Robin [name] Date of Encounter: 03/14/2012    SUBJECTIVE: Worried about her medical bills. Having chest pain. Poor appetite  TELEMETRY:  A tracking: Filed Vitals:   03/14/12 0500 03/14/12 0600 03/14/12 0700 03/14/12 0740  BP: 113/45 111/51 116/58   Pulse: 76 71 73 73  Temp:      TempSrc:      Resp: 17 17 18 8   Height:      Weight:      SpO2: 98% 98% 96% 96%    Intake/Output Summary (Last 24 hours) at 03/14/12 0833 Last data filed at 03/14/12 0600  Gross per 24 hour  Intake    824 ml  Output   1197 ml  Net   -373 ml    LABS: Basic Metabolic Panel:  Basename 03/14/12 0455 03/13/12 1350  NA 134* 137  K 3.8 4.7  CL 99 101  CO2 27 26  GLUCOSE 130* 101*  BUN 14 12  CREATININE 0.59 0.62  CALCIUM 8.4 9.2  MG 1.9 --  PHOS 3.2 --   CBC:  Basename 03/14/12 0455 03/13/12 1350  WBC 7.9 9.3  NEUTROABS -- --  HGB 10.4* 11.5*  HCT 30.3* 33.0*  MCV 88.9 89.4  PLT 205 205   Cardiac Enzymes:  Basename 03/11/12 1230 03/11/12 1000  CKTOTAL -- --  CKMB -- --  CKMBINDEX -- --  TROPONINI <0.30 <0.30   Physical Exam: Blood pressure 116/58, pulse 73, temperature 97.8 F (36.6 C), temperature source Oral, resp. rate 8, height 4\' 11"  (1.499 m), weight 64.2 kg (141 lb 8.6 oz), SpO2 96.00%. Weight change:    No pericardial rub heard  ASSESSMENT: 1. S/P Pacemaker for 2nd degree AV block 2. Diastolic heart failure, stable 3. Bilateral pneumothoraces   Plan:  1. Pneumothorax decompression 2. Ambulation when possible 3. Consult case manager about coverage and finances  Signed, Lesleigh Noe 03/14/2012, 8:33 AM

## 2012-03-14 NOTE — Progress Notes (Signed)
1 Day Post-Op Procedure(s) (LRB): CHEST TUBE INSERTION (Right) Subjective: bilat chest ttubes with re-expansion but small air leaks  Objective: Vital signs in last 24 hours: Temp:  [97.5 F (36.4 C)-98.7 F (37.1 C)] 98.6 F (37 C) (01/27 1600) Pulse Rate:  [69-103] 89  (01/27 1600) Cardiac Rhythm:  [-] Ventricular paced (01/27 1200) Resp:  [2-22] 19  (01/27 1600) BP: (99-147)/(41-66) 137/66 mmHg (01/27 1600) SpO2:  [94 %-100 %] 95 % (01/27 1600) Weight:  [141 lb 8.6 oz (64.2 kg)] 141 lb 8.6 oz (64.2 kg) (01/27 0459)  Hemodynamic parameters for last 24 hours:  stable  Intake/Output from previous day: 01/26 0701 - 01/27 0700 In: 924 [P.O.:160; I.V.:564; IV Piggyback:200] Out: 1207 [Urine:1025; Blood:50; Chest Tube:132] Intake/Output this shift: Total I/O In: 950 [P.O.:550; I.V.:200; IV Piggyback:200] Out: 455 [Urine:375; Chest Tube:80]    Lab Results:  Basename 03/14/12 0455 03/13/12 1350  WBC 7.9 9.3  HGB 10.4* 11.5*  HCT 30.3* 33.0*  PLT 205 205   BMET:  Basename 03/14/12 0455 03/13/12 1350  NA 134* 137  K 3.8 4.7  CL 99 101  CO2 27 26  GLUCOSE 130* 101*  BUN 14 12  CREATININE 0.59 0.62  CALCIUM 8.4 9.2    PT/INR:  Basename 03/13/12 1350  LABPROT 13.2  INR 1.01   ABG    Component Value Date/Time   PHART 7.361 03/11/2012 2023   HCO3 23.3 03/11/2012 2023   TCO2 25 03/11/2012 2023   ACIDBASEDEF 2.0 03/11/2012 2023   O2SAT 93.0 03/11/2012 2023   CBG (last 3)   Basename 03/13/12 0805 03/12/12 0823  GLUCAP 86 104*    Assessment/Plan: S/P Procedure(s) (LRB): CHEST TUBE INSERTION (Right) Continue suction to both tubes CXR in AM   LOS: 4 days    VAN TRIGT III,PETER 03/14/2012

## 2012-03-14 NOTE — Op Note (Signed)
Robin [name], Robin [name] NO.:  [id number]  MEDICAL RECORD NO.:  [id number]  LOCATION:  2908                         FACILITY:  [facility]  PHYSICIAN:  Kerin Perna, M.D.  DATE OF BIRTH:  1928-06-24  DATE OF PROCEDURE:  03/13/2012 DATE OF DISCHARGE:                              OPERATIVE REPORT   OPERATION:  Placement of right chest tube.  PREOPERATIVE DIAGNOSIS:  Persistent significant right pneumothorax despite 2 previous chest tube placements.  POSTOPERATIVE DIAGNOSIS:  Persistent significant right pneumothorax despite 2 previous chest tube placements.  SURGEON:  Kerin Perna, MD  ANESTHESIA:  MAC with local 1% lidocaine.  OPERATIVE PROCEDURE:  The patient was brought from the CCU to the operating room after discussing right chest tube placement with the patient and family and obtaining informed consent for the procedures including discussion of alternatives, potential risks, and benefits.  A proper time-out was performed after the right chest was prepped and draped as a sterile field.  The previously placed chest tubes had been removed.  Lidocaine 1% was infiltrated around the previously placed chest tube incision in the inframammary crease on the right side. Further lidocaine was infiltrated in the chest wall in between the ribs. The pleural space was entered in the fifth interspace and air rushed out.  A 28-French Arg[place] chest tube was then placed into the right pleural space and directed to the apex and secured to the skin with 2 silk sutures.  The chest tube was connected to an underwater seal Pleur- Evac drainage system.  There was good fluctuation of the water level with each inspiration.  The patient remained with conscious sedation spontaneously breathing during the procedure and remained stable.  A sterile dressing was applied.  Intraoperative chest x-ray was performed showing good position of the chest tube with resolution of the pneumothorax.   The patient then returned to recovery room in stable condition.     Kerin Perna, M.D.     PV/MEDQ  D:  03/13/2012  T:  03/14/2012  Job:  [id number]

## 2012-03-15 ENCOUNTER — Inpatient Hospital Stay (HOSPITAL_COMMUNITY): Payer: Medicare Other

## 2012-03-15 DIAGNOSIS — R42 Dizziness and giddiness: Secondary | ICD-10-CM

## 2012-03-15 LAB — COMPREHENSIVE METABOLIC PANEL
ALT: 16 U/L (ref 0–35)
AST: 18 U/L (ref 0–37)
CO2: 27 mEq/L (ref 19–32)
Chloride: 104 mEq/L (ref 96–112)
Creatinine, Ser: 0.53 mg/dL (ref 0.50–1.10)
GFR calc non Af Amer: 87 mL/min — ABNORMAL LOW (ref >90)
Glucose, Bld: 106 mg/dL — ABNORMAL HIGH (ref 70–99)
Sodium: 141 mEq/L (ref 135–145)
Total Bilirubin: 0.2 mg/dL — ABNORMAL LOW (ref 0.3–1.2)

## 2012-03-15 LAB — CBC
MCH: 31.1 pg (ref 26.0–34.0)
MCHC: 35.4 g/dL (ref 30.0–36.0)
MCV: 87.7 fL (ref 78.0–100.0)
Platelets: 214 10*3/uL (ref 150–400)

## 2012-03-15 MED ORDER — MAGNESIUM HYDROXIDE 400 MG/5ML PO SUSP
30.0000 mL | Freq: Every day | ORAL | Status: DC
  Administered 2012-03-15: 30 mL via ORAL
  Filled 2012-03-15 (×2): qty 30

## 2012-03-15 MED ORDER — SENNA 8.6 MG PO TABS
2.0000 | ORAL_TABLET | Freq: Every day | ORAL | Status: DC
  Administered 2012-03-15: 17.2 mg via ORAL
  Filled 2012-03-15 (×2): qty 2

## 2012-03-15 NOTE — Progress Notes (Signed)
2 Days Post-Op Procedure(s) (LRB): CHEST TUBE INSERTION (Right) Subjective  Bilateral pneumothorax with chest tubes Both lungs well expanded L pleurovac with very slight air leak - R pleurovac with moderate air leak  Objective: Vital signs in last 24 hours: Temp:  [97.5 F (36.4 C)-98.6 F (37 C)] 98.3 F (36.8 C) (01/28 1200) Pulse Rate:  [66-97] 80  (01/28 1200) Cardiac Rhythm:  [-] Ventricular paced (01/28 1200) Resp:  [9-22] 17  (01/28 1200) BP: (114-147)/(41-72) 114/54 mmHg (01/28 1200) SpO2:  [89 %-97 %] 96 % (01/28 1200) Weight:  [141 lb 5 oz (64.1 kg)] 141 lb 5 oz (64.1 kg) (01/28 0500)  Hemodynamic parameters for last 24 hours:  nsr  Intake/Output from previous day: 01/27 0701 - 01/28 0700 In: 1550 [P.O.:870; I.V.:480; IV Piggyback:200] Out: 1655 [Urine:1375; Chest Tube:280] Intake/Output this shift: Total I/O In: 785 [P.O.:625; I.V.:160] Out: 850 [Urine:850]  Lungs clear  Lab Results:  Basename 03/15/12 0518 03/14/12 0455  WBC 7.2 7.9  HGB 10.9* 10.4*  HCT 30.8* 30.3*  PLT 214 205   BMET:  Basename 03/15/12 0518 03/14/12 0455  NA 141 134*  K 3.9 3.8  CL 104 99  CO2 27 27  GLUCOSE 106* 130*  BUN 11 14  CREATININE 0.53 0.59  CALCIUM 9.2 8.4    PT/INR:  Basename 03/13/12 1350  LABPROT 13.2  INR 1.01   ABG    Component Value Date/Time   PHART 7.361 03/11/2012 2023   HCO3 23.3 03/11/2012 2023   TCO2 25 03/11/2012 2023   ACIDBASEDEF 2.0 03/11/2012 2023   O2SAT 93.0 03/11/2012 2023   CBG (last 3)   Basename 03/13/12 0805  GLUCAP 86    Assessment/Plan: S/P  L chest tube to water seal, cont suction on R tube   LOS: 5 days    VAN TRIGT III,Yaseen Gilberg 03/15/2012

## 2012-03-15 NOTE — Progress Notes (Signed)
TCTS DAILY PROGRESS NOTE                   [address]            [place] [address]          [phone]      2 Days Post-Op Procedure(s) (LRB): CHEST TUBE INSERTION (Right)  Total Length of Stay:  LOS: 5 days   Subjective: Feels ok , some discomforts, difficult to cough  Objective: Vital signs in last 24 hours: Temp:  [97.5 F (36.4 C)-98.7 F (37.1 C)] 97.5 F (36.4 C) (01/28 0800) Pulse Rate:  [66-97] 80  (01/28 0800) Cardiac Rhythm:  [-] Ventricular paced (01/28 0800) Resp:  [9-22] 15  (01/28 0800) BP: (116-147)/(41-72) 136/66 mmHg (01/28 0800) SpO2:  [89 %-98 %] 96 % (01/28 0800) Weight:  [141 lb 5 oz (64.1 kg)] 141 lb 5 oz (64.1 kg) (01/28 0500)  Filed Weights   03/12/12 0500 03/14/12 0459 03/15/12 0500  Weight: 139 lb 15.9 oz (63.5 kg) 141 lb 8.6 oz (64.2 kg) 141 lb 5 oz (64.1 kg)    Weight change: -3.5 oz (-0.1 kg)   Hemodynamic parameters for last 24 hours:    Intake/Output from previous day: 01/27 0701 - 01/28 0700 In: 1550 [P.O.:870; I.V.:480; IV Piggyback:200] Out: 1655 [Urine:1375; Chest Tube:280]  Intake/Output this shift: Total I/O In: 385 [P.O.:325; I.V.:60] Out: -   Current Meds: Scheduled Meds:   . aspirin EC  81 mg Oral Daily  . docusate sodium  100 mg Oral BID  . enoxaparin  40 mg Subcutaneous Q24H  . feeding supplement  1 Container Oral TID BM  . magnesium hydroxide  30 mL Oral Daily  . polyethylene glycol  17 g Oral BID  . senna  2 tablet Oral Daily   Continuous Infusions:   . sodium chloride 10 mL/hr at 03/12/12 0800  . 0.9 % NaCl with KCl 20 mEq / L 20 mL/hr at 03/15/12 0758   PRN Meds:.acetaminophen, fentaNYL, ondansetron (ZOFRAN) IV, oxyCODONE, traMADol, zolpidem  General appearance: alert, cooperative and no distress Heart: regular rate and rhythm Lungs: dim in bases  Lab Results: CBC: Basename 03/15/12 0518 03/14/12 0455  WBC 7.2 7.9  HGB 10.9* 10.4*  HCT 30.8* 30.3*  PLT 214 205   BMET:    Basename 03/15/12 0518 03/14/12 0455  NA 141 134*  K 3.9 3.8  CL 104 99  CO2 27 27  GLUCOSE 106* 130*  BUN 11 14  CREATININE 0.53 0.59  CALCIUM 9.2 8.4    PT/INR:  Basename 03/13/12 1350  LABPROT 13.2  INR 1.01   Radiology: Ct Head Wo Contrast  03/14/2012  *RADIOLOGY REPORT*  Clinical Data: Visual disturbance.  CT HEAD WITHOUT CONTRAST  Technique:  Contiguous axial images were obtained from the base of the skull through the vertex without contrast.  Comparison: MRI brain 03/10/2012.  Findings: The ventricles are normal in size and in the midline without mass effect or shift. Calcified correlate plexus in the occipital regions.  No extra-axial fluid collections are identified.  No CT findings for acute hemispheric infarction and/or intracranial hemorrhage.  No mass lesions.  The brainstem and cerebellum are grossly normal.  The bony structures are intact.  The paranasal sinuses and mastoid air cells are clear.  Globes are intact.  IMPRESSION: No acute intracranial findings or skull fracture.   Original Report Authenticated By: Rudie Meyer, M.D.    Dg Chest [facility]  03/15/2012  *RADIOLOGY REPORT*  Clinical Data: Respiratory failure  PORTABLE CHEST - 1 VIEW  Comparison: Yesterday  Findings: Bilateral chest tubes are stable.  Less than 5% bilateral apical pneumothorax are stable.  Bibasilar airspace opacities left greater than right are stable.  Mild cardiomegaly.  Dual lead right subclavian pacemaker device and leads are stable.  IMPRESSION: Stable small bilateral pneumothoraces and bilateral airspace opacities.   Original Report Authenticated By: Jolaine Click, M.D.    Dg Chest Port 1 View  03/14/2012  *RADIOLOGY REPORT*  Clinical Data: Respiratory failure.  PORTABLE CHEST - 1 VIEW  Comparison: Chest x-ray 03/13/2012.  Findings: Bilateral chest tubes in position with tip near the apices bilaterally.  Extensive bibasilar opacities may reflect areas of atelectasis and/or consolidation.  A  small left pleural effusion.  Trace bilateral apical pneumothoraces (less than 5% of the volume of the each of the hemithoraces).  Pulmonary venous congestion, without frank pulmonary edema.  Heart size is within normal limits. The patient is rotated to the left on today's exam, resulting in distortion of the mediastinal contours and reduced diagnostic sensitivity and specificity for mediastinal pathology. Right-sided pacemaker device in place with lead tips projecting over the expected location of the right atrium and right ventricular apex.  IMPRESSION: 1.  Support apparatus, as above. 2.  Trace bilateral apical pneumothoraces are very small. 3.  Bibasilar atelectasis and/or consolidation (left greater than right) with superimposed small left pleural effusion.   Original Report Authenticated By: Trudie Reed, M.D.    Dg Chest Portable 1 View  03/13/2012  *RADIOLOGY REPORT*  Clinical Data: 79 year old female chest tube placement.  PORTABLE CHEST - 1 VIEW  Comparison: 0957 hours the same day and earlier.  Findings: Portable supine AP view 1528 hours.  Right side chest tube position adjusted.  Right pneumothorax no longer visible. Pigtail type right pleural catheter no longer visible. Subcutaneous gas appears mildly increased along the right lower chest wall.  Stable left chest tube. No left side pneumothorax identified.  Lower lung volumes.  Stable cardiac size and mediastinal contours.  Stable increased bibasilar opacity.  IMPRESSION: 1.  Right chest tube repositioned.  Right pneumothorax no longer visible.  Pigtail right pleural catheter no longer identified. 2.  Stable left chest tube.  No left pneumothorax. 2.  Lower lung volumes.   Original Report Authenticated By: Erskine Speed, M.D.      Assessment/Plan: S/P Procedure(s) (LRB): CHEST TUBE INSERTION (Right)  1 small bilat pntx with +air leaks present. Cont CT's to H2O seal for now    Isaish Alemu E 03/15/2012 10:13 AM

## 2012-03-15 NOTE — Progress Notes (Signed)
Per pt report, pt had large formed bowel movement this evening. Family emptied commode before this RN was able to visualize.

## 2012-03-15 NOTE — Progress Notes (Signed)
Patient has no care team.   HPI  Robin [name] is a 79 y.o. female With a great deal of pain last night  Breathing better and pain better now   Past Medical History  Diagnosis Date  . LBBB (left bundle branch block)-rate related   . Narcolepsy   . Vulva cancer   . Mobitz type 2 second degree AV block     2:1  with RBBB    Past Surgical History  Procedure Date  . Wrist fracture surgery   . Hernia repair   . Mastoidectomy revision   . Tonsillectomy   . Abdominal surgery     exploratory  . Abdominal hysterectomy   . Bowel resection   . Chest tube insertion 03/13/2012    Procedure: CHEST TUBE INSERTION;  Surgeon: Kerin Perna, MD;  Location: [facility] OR;  Service: Thoracic;  Laterality: Right;    Current Facility-Administered Medications  Medication Dose Route Frequency Provider Last Rate Last Dose  . 0.9 %  sodium chloride infusion   Intravenous Continuous Curlene Dolphin, MD 10 mL/hr at 03/12/12 0800    . 0.9 % NaCl with KCl 20 mEq/ L  infusion   Intravenous Continuous Kerin Perna, MD 20 mL/hr at 03/15/12 [id number]    . acetaminophen (TYLENOL) tablet 650 mg  650 mg Oral Q6H PRN Lyn Records III, MD   650 mg at 03/14/12 2256  . aspirin EC tablet 81 mg  81 mg Oral Daily Lyn Records III, MD   81 mg at 03/15/12 0941  . docusate sodium (COLACE) capsule 100 mg  100 mg Oral BID Lyn Records III, MD   100 mg at 03/15/12 0941  . enoxaparin (LOVENOX) injection 40 mg  40 mg Subcutaneous Q24H Kerin Perna, MD   40 mg at 03/14/12 1829  . feeding supplement (RESOURCE BREEZE) liquid 1 Container  1 Container Oral TID BM Haynes Bast, RD   1 Container at 03/15/12 1400  . fentaNYL (SUBLIMAZE) injection 12.5 mcg  12.5 mcg Intravenous Q2H PRN Nelda Bucks, MD   12.5 mcg at 03/15/12 0147  . magnesium hydroxide (MILK OF MAGNESIA) suspension 30 mL  30 mL Oral Daily Maitri S Kalia-Reynolds, DO   30 mL at 03/15/12 0940  . ondansetron (ZOFRAN) injection 4 mg  4 mg  Intravenous Q6H PRN Duke Salvia, MD   4 mg at 03/11/12 2313  . oxyCODONE (Oxy IR/ROXICODONE) immediate release tablet 5 mg  5 mg Oral Q4H PRN Kerin Perna, MD   5 mg at 03/15/12 1422  . polyethylene glycol (MIRALAX / GLYCOLAX) packet 17 g  17 g Oral BID Maitri S Kalia-Reynolds, DO   17 g at 03/14/12 2131  . senna (SENOKOT) tablet 17.2 mg  2 tablet Oral Daily Maitri S Kalia-Reynolds, DO   17.2 mg at 03/15/12 0941  . traMADol (ULTRAM) tablet 50 mg  50 mg Oral Q6H PRN Kerin Perna, MD      . zolpidem [facility]) tablet 5 mg  5 mg Oral QHS PRN Lesleigh Noe, MD        Allergies  Allergen Reactions  . Influenza A (H1n1) Monoval Pf   . Penicillins Hives and Rash    Review of Systems negative except from HPI and PMH  Physical Exam BP 114/54  Pulse 80  Temp 98.3 F (36.8 C) (Oral)  Resp 17  Ht 4\' 11"  (1.499 m)  Wt 141 lb 5 oz (64.1 kg)  BMI 28.54 kg/m2  SpO2 96% Well developed and well nourished in no acute distress HENT normal E scleral and icterus clear Neck Supple JVP flat; carotids brisk and full Clear to ausculation B chest tubes in place  Regular rate and rhythm, no murmurs gallops or rub Soft with active bowel sounds No clubbing cyanosis 1+ Edema Alert and oriented, grossly normal motor and sensory function Skin Warm and Dry    Assessment and  Plan  Follow along

## 2012-03-15 NOTE — Progress Notes (Signed)
Per Dr Donata Clay, left chest tube water seal. Pt resting in chair. No further orders received. Pt awaiting open bed for transfer to 2900 stepdown.

## 2012-03-15 NOTE — Progress Notes (Signed)
Patient Name: Robin [name] Date of Encounter: 03/15/2012    SUBJECTIVE: She feels miserable. She is unable to sleep. She has chest discomfort with deep breathing. There was some left facial numbness yesterday that led to a CT scan. She has also had chest pressure.  TELEMETRY:  Atrial tracking and AV sequential pacing: Filed Vitals:   03/15/12 0400 03/15/12 0500 03/15/12 0600 03/15/12 0800  BP: 140/61  116/41 136/66  Pulse: 66 70 72 80  Temp: 98.6 F (37 C)     TempSrc: Oral   Oral  Resp: 14 15 15 15   Height:      Weight:  64.1 kg (141 lb 5 oz)    SpO2: 96% 96% 97% 96%    Intake/Output Summary (Last 24 hours) at 03/15/12 0854 Last data filed at 03/15/12 0800  Gross per 24 hour  Intake   1590 ml  Output   1655 ml  Net    -65 ml    LABS: Basic Metabolic Panel:  Basename 03/15/12 0518 03/14/12 0455  NA 141 134*  K 3.9 3.8  CL 104 99  CO2 27 27  GLUCOSE 106* 130*  BUN 11 14  CREATININE 0.53 0.59  CALCIUM 9.2 8.4  MG 2.0 1.9  PHOS 3.6 3.2   CBC:  Basename 03/15/12 0518 03/14/12 0455  WBC 7.2 7.9  NEUTROABS -- --  HGB 10.9* 10.4*  HCT 30.8* 30.3*  MCV 87.7 88.9  PLT 214 205    Radiology/Studies:  *RADIOLOGY REPORT*  Clinical Data: Respiratory failure  PORTABLE CHEST - 1 VIEW  Comparison: Yesterday  Findings: Bilateral chest tubes are stable. Less than 5% bilateral  apical pneumothorax are stable. Bibasilar airspace opacities left  greater than right are stable. Mild cardiomegaly. Dual lead right  subclavian pacemaker device and leads are stable.  IMPRESSION:  Stable small bilateral pneumothoraces and bilateral airspace  opacities.  Original Report Authenticated By: Jolaine Click, M.D.   Physical Exam: Blood pressure 136/66, pulse 80, temperature 98.6 F (37 C), temperature source Oral, resp. rate 15, height 4\' 11"  (1.499 m), weight 64.1 kg (141 lb 5 oz), SpO2 96.00%. Weight change: -0.1 kg (-3.5 oz)   Decreased breath sounds  Cardiac exam  unremarkable  ASSESSMENT:  1. Bilateral apical pneumothoraces with continued air leak on right  2. Second degree AV block status post AV pacemaker  3. Possible diastolic heart failure  Plan:  1. Check BNP  2. Consider diuresis.  [name] 03/15/2012, 8:54 AM

## 2012-03-16 ENCOUNTER — Inpatient Hospital Stay (HOSPITAL_COMMUNITY): Payer: Medicare Other

## 2012-03-16 MED ORDER — OXYCODONE HCL 5 MG PO TABS
5.0000 mg | ORAL_TABLET | ORAL | Status: DC | PRN
  Administered 2012-03-16 – 2012-03-17 (×7): 5 mg via ORAL
  Filled 2012-03-16 (×7): qty 1

## 2012-03-16 NOTE — Progress Notes (Addendum)
                   [address]            [place] [address]          [phone]    3 Days Post-Op Procedure(s) (LRB): CHEST TUBE INSERTION (Right)  Subjective: Patient with occasional pain at chest tube sites.  Objective: Vital signs in last 24 hours: Temp:  [98.2 F (36.8 C)-99.3 F (37.4 C)] 98.2 F (36.8 C) (01/29 1235) Pulse Rate:  [77-96] 96  (01/29 1235) Cardiac Rhythm:  [-] Ventricular paced (01/29 0800) Resp:  [14-25] 18  (01/29 0800) BP: (106-146)/(50-94) 118/84 mmHg (01/29 1230) SpO2:  [93 %-98 %] 93 % (01/29 1235)     Intake/Output from previous day: 01/28 0701 - 01/29 0700 In: 1375 [P.O.:875; I.V.:500] Out: 1730 [Urine:1500; Chest Tube:230]   Physical Exam:  Cardiovascular: RRR Pulmonary: Diminished at bases Abdomen: Soft, non tender, bowel sounds present. Wounds: Clean and dry.  No erythema or signs of infection. Chest Tube: right to suction with +2-3 air leak;left is to water seal and some tidling in chest tube but no air leak in pleura vac  Lab Results: CBC: Basename 03/15/12 0518 03/14/12 0455  WBC 7.2 7.9  HGB 10.9* 10.4*  HCT 30.8* 30.3*  PLT 214 205   BMET:  Basename 03/15/12 0518 03/14/12 0455  NA 141 134*  K 3.9 3.8  CL 104 99  CO2 27 27  GLUCOSE 106* 130*  BUN 11 14  CREATININE 0.53 0.59  CALCIUM 9.2 8.4    PT/INR: No results found for this basename: LABPROT,INR in the last 72 hours ABG:  INR: Will add last result for INR, ABG once components are confirmed Will add last 4 CBG results once components are confirmed  Assessment/Plan:  1. CV - SR. 2.  Pulmonary - Chest tube output 230 cc for last 24 hours. CXR this am shows small bilateral apical pneumothoraces, right base atelectasis, minor subcutaneous emphysma right lateral chest wall, probable small right basilar pneumothorax, and persistent LLL consolidation. Right chest tube with moderate air leak+2-3. Right chest tube is to suction and will remain for  now.Left chest tube brief air leak with cough but this then resolved. Does have some tidling of left chest tube.Left chest tube is to water seal. Possibly remove left chest tube in am.    Robin [name],Robin [name] 03/16/2012,3:13 PM  patient examined and medical record reviewed,agree with above note. Robin [name],Robin [name] 03/17/2012

## 2012-03-16 NOTE — Progress Notes (Signed)
Bilateral chest tube dressings changed per protocol.

## 2012-03-16 NOTE — Progress Notes (Signed)
PULMONARY  / CRITICAL CARE MEDICINE  Name: Minna Dumire MRN: [id number] DOB: 1928-12-10    LOS: 6  REFERRING PROVIDER:  Dr. Graciela Husbands  CHIEF COMPLAINT:  Bilateral Pneumothorax  BRIEF PATIENT DESCRIPTION: 78 y/o F with Mobitz 2 AV block with RBBB admitted on 1/24 for dual chamber pacemaker insertion with complication of bilateral pneumothorax. BL CT placed 1/24, right CT replaced for larger on 1/26 2/2 persistent PTX.  LINES / TUBES: 1/24 R CT x 2 >> 1/26, replaced 1/26 (TCTS) >>> 1/24 L CT >>>  CULTURES: 1/26 MRSA Nasal swab PCR >> neg  ANTIBIOTICS: None  SIGNIFICANT EVENTS:  1/24 - Admit for dual chamber pacer, bilateral PTX requiring CT x2 1/26 - Persistent BL PTX with right 20% PTX despite pigtail catheter. 1/26 - Right chest tube replacement (with larger size) 1/27 - Improving PTX 1/28 - left chest tube water sealed. Improving bilateral pneumothorax.   LEVEL OF CARE:  SDU PRIMARY SERVICE:  Cardiology CONSULTANTS:  PCCM CODE STATUS:  Full DIET:  Heart Healthy DVT Px:  Lovenox GI Px:  None   INTERVAL HISTORY:  Still with right chest tube pain. Did have a large bowel movement. No numbness or tingling on her face her eye today. oob to chair   VITAL SIGNS: Temp:  [97.5 F (36.4 C)-99.3 F (37.4 C)] 98.7 F (37.1 C) (01/29 0400) Pulse Rate:  [77-91] 77  (01/29 0400) Resp:  [14-25] 14  (01/29 0400) BP: (110-146)/(50-66) 110/50 mmHg (01/29 0400) SpO2:  [96 %] 96 % (01/29 0400)  PHYSICAL EXAMINATION: General:  Elderly female s/p bilateral CT placement Neuro:  AAOx4 pre-procedure, MAE HEENT:  Mm pink/moist, no jvd Cardiovascular:  s1s2 rrr, no m/r/g Lungs:  lungs bilaterally diminished Abdomen:  Round/soft, bsx4 active Musculoskeletal: no acute deform Skin:  Warm/dry, no edema   IMAGING:  pCXR (1/29) - stable lt apical & rt basal pneumothoraces.  CT head (1/28) - No acute intracranial findings or skull fracture.   ASSESSMENT /  PLAN:  PULMONARY: A: 1. Bilateral Pneumothorax, iatrogenic - pacemaker placement 1/24. BL CT placed - improving, s/p replacement with larger chest tube 1/26. Air leak on right with coughing, deep breaths. Very slight air leak on left. 2. Mild LLL ATX  P: - Right CT to suction 20 cm, Lt to water seal, air leak present BL -left with coughing - O2 to support sats >93% - Incentive spirometry - Pain management - oxycodone.  CARDIOVASCULAR A: History of 2:1 AV block, s/p pacemaker implantation 1/24 - CE neg x 3 P: Per cardiology (primary service).  GASTROINTESTINAL A: Constipation - resolving. P: Will discontinue senna and MiraLax. Continue her Colace.   HEMATOLOGIC  Lab 03/15/12 0518 03/14/12 0455  WBC 7.2 7.9  HGB 10.9* 10.4*  HCT 30.8* 30.3*  MCV 87.7 88.9  PLT 214 205   A: Acute normocytic anemia - stable. Likely 2/2 procedural blood loss.  P: Continue to monitor CBC, coags.   CLINICAL SUMMARY: 79 y/o F with,  Mobitz 2 AV block admitted 1/24 for dual chamber pacemaker insertion -bilateral pneumothorax, resolving. OK for SDU transfer.   Signed: Johnette Abraham, Roma Schanz, Internal Medicine Resident Pager: [phone] (7AM-5PM)  Oretha Milch.

## 2012-03-16 NOTE — Progress Notes (Addendum)
Still with chest tubes and uncomfortable. No leak on left. Chest feels tight. Increase frequency on Oxycodone.. Continue to follow. Check Troponin I

## 2012-03-17 ENCOUNTER — Inpatient Hospital Stay (HOSPITAL_COMMUNITY): Payer: Medicare Other

## 2012-03-17 NOTE — Progress Notes (Signed)
NUTRITION FOLLOW UP  Intervention:   1. D/c magic cup 2. Continue Resource Breeze 3. RD will continue to follow    Nutrition Dx:   Inadequate oral intake related to missing dentures and poor appetite as evidenced by family report. Resolving   Goal:   PO intake to meet >/=90% estimated nutrition needs. Likely met   Monitor:   PO intake, weight trends, I/O's, labs   Assessment:   Pt is s/p chest tube placement 1/24, still has them at this time, possible removal of left tube soon?  Weight is trending up, intake appears to be improving. Supplements of Resource Breeze TID, and Magic Cup TID still ordered. Like Resource, will continue, does not like [employer], RD will d/c. Not eating 100% of meals related to large portion sizes provided. Likely meeting nutrition needs with current PO intake.   Height: Ht Readings from Last 1 Encounters:  03/11/12 4\' 11"  (1.499 m)    Weight Status:   Wt Readings from Last 1 Encounters:  03/17/12 142 lb 6.7 oz (64.6 kg)    Re-estimated needs:  Kcal: 1500 - 1700 kcal  Protein: 75 - 85 grams protein  Fluid: 1.5 - 1.8 liters daily   Skin: s/p chest tube placements   Diet Order: Dysphagia 3, thin liquids   Intake/Output Summary (Last 24 hours) at 03/17/12 1031 Last data filed at 03/17/12 0600  Gross per 24 hour  Intake   1040 ml  Output    978 ml  Net     62 ml    Last BM: 1/28   Labs:   Lab 03/15/12 0518 03/14/12 0455 03/13/12 1350 03/10/12 1655  NA 141 134* 137 --  K 3.9 3.8 4.7 --  CL 104 99 101 --  CO2 27 27 26  --  BUN 11 14 12  --  CREATININE 0.53 0.59 0.62 --  CALCIUM 9.2 8.4 9.2 --  MG 2.0 1.9 -- 2.3  PHOS 3.6 3.2 -- 3.1  GLUCOSE 106* 130* 101* --    CBG (last 3)  No results found for this basename: GLUCAP:3 in the last 72 hours  Scheduled Meds:   . aspirin EC  81 mg Oral Daily  . docusate sodium  100 mg Oral BID  . enoxaparin  40 mg Subcutaneous Q24H  . feeding supplement  1 Container Oral TID BM     Continuous Infusions:   Clarene Duke RD, LDN Pager [phone] After Hours pager [phone]

## 2012-03-17 NOTE — Progress Notes (Addendum)
Patient Name: Ajai Terhaar Date of Encounter: 03/17/2012    SUBJECTIVE: She is more comfortable today. She had a good nights sleep. Her appetite is improving. She still has significant chest discomfort from chest tubes 100 better controlled.  TELEMETRY:  AV sequential pacing.: Filed Vitals:   03/17/12 0400 03/17/12 0500 03/17/12 0800 03/17/12 0840  BP: 112/56  115/63   Pulse: 77   71  Temp:    98.6 F (37 C)  TempSrc:    Oral  Resp: 20     Height:      Weight:  64.6 kg (142 lb 6.7 oz)    SpO2: 95%  94%     Intake/Output Summary (Last 24 hours) at 03/17/12 0918 Last data filed at 03/17/12 0600  Gross per 24 hour  Intake   1040 ml  Output    978 ml  Net     62 ml    LABS: Basic Metabolic Panel:  Basename 03/15/12 0518  NA 141  K 3.9  CL 104  CO2 27  GLUCOSE 106*  BUN 11  CREATININE 0.53  CALCIUM 9.2  MG 2.0  PHOS 3.6   CBC:  Basename 03/15/12 0518  WBC 7.2  NEUTROABS --  HGB 10.9*  HCT 30.8*  MCV 87.7  PLT 214   Cardiac Enzymes:  Basename 03/16/12 0940  CKTOTAL --  CKMB --  CKMBINDEX --  TROPONINI <0.30    Physical Exam: Blood pressure 115/63, pulse 71, temperature 98.6 F (37 C), temperature source Oral, resp. rate 20, height 4\' 11"  (1.499 m), weight 64.6 kg (142 lb 6.7 oz), SpO2 94.00%. Weight change:    No pericardial or pleural friction rub  No edema.  ASSESSMENT:  1. High-grade A-V block now treated with DDD pacemaker  2. Post procedure complication of bilateral pneumothoraces currently with bilateral chest tubes. No air leak noted on left. Minimal air leak on right.  Plan:  1. Chest tube management per CVTS, Dr. Donata Clay  2. Continue current level of analgesia  3. DC IV fluids  Signed, Lesleigh Noe 03/17/2012, 9:18 AM

## 2012-03-17 NOTE — Progress Notes (Signed)
PULMONARY  / CRITICAL CARE MEDICINE  Name: Robin [name] MRN: [id number] DOB: 1929-01-22    LOS: 7  REFERRING PROVIDER:  Dr. Graciela Husbands  CHIEF COMPLAINT:  Bilateral Pneumothorax  BRIEF PATIENT DESCRIPTION: 79 y/o F with Mobitz 2 AV block with RBBB admitted on 1/24 for dual chamber pacemaker insertion with complication of bilateral pneumothorax. BL CT placed 1/24, right CT replaced for larger on 1/26 2/2 persistent PTX.  LINES / TUBES: 1/24 R CT x 2 >> 1/26, replaced 1/26 (TCTS) >>> 1/24 L CT >>>1/30  CULTURES: 1/26 MRSA Nasal swab PCR >> neg  ANTIBIOTICS: None  SIGNIFICANT EVENTS:  1/24 - Admit for dual chamber pacer, bilateral PTX requiring CT x2 1/26 - Persistent BL PTX with right 20% PTX despite pigtail catheter. 1/26 - Right chest tube replacement (with larger size) 1/27 - Improving PTX 1/28 - left chest tube water sealed. Improving bilateral pneumothorax.   LEVEL OF CARE:  SDU PRIMARY SERVICE:  Cardiology CONSULTANTS:  PCCM CODE STATUS:  Full DIET:  Heart Healthy DVT Px:  Lovenox GI Px:  None   INTERVAL HISTORY:   chest tube pain better. oob to chair afebrile   VITAL SIGNS: Temp:  [97.6 F (36.4 C)-98.6 F (37 C)] 98.5 F (36.9 C) (01/30 1303) Pulse Rate:  [71-104] 104  (01/30 1303) Resp:  [16-20] 20  (01/30 0400) BP: (106-132)/(45-63) 132/54 mmHg (01/30 1303) SpO2:  [89 %-96 %] 92 % (01/30 1303) Weight:  [64.6 kg (142 lb 6.7 oz)] 64.6 kg (142 lb 6.7 oz) (01/30 0500)  PHYSICAL EXAMINATION: General:  Elderly female s/p bilateral CT placement Neuro:  AAOx4 pre-procedure, MAE HEENT:  Mm pink/moist, no jvd Cardiovascular:  s1s2 rrr, no m/r/g Lungs:  lungs bilaterally diminished, no air leak on left, rt persists Abdomen:  Round/soft, bsx4 active Musculoskeletal: no acute deform Skin:  Warm/dry, no edema   IMAGING:  pCXR (1/30) - stable small bi-apical pneumothoraces.  CT head (1/28) - No acute intracranial findings or skull  fracture.   ASSESSMENT / PLAN:  PULMONARY: A: 1. Bilateral Pneumothorax, iatrogenic - pacemaker placement 1/24. BL CT placed - improving, s/p replacement with larger chest tube 1/26. Air leak on right with coughing, deep breaths. Very slight air leak on left. 2. Mild LLL ATX  P: - Right CT to suction 20 cm, dc Lt  - O2 to support sats >93% - Incentive spirometry - Pain management - oxycodone.  CARDIOVASCULAR A: History of 2:1 AV block, s/p pacemaker implantation 1/24 - CE neg x 3 P: Per cardiology (primary service).  GASTROINTESTINAL A: Constipation - resolving. P:  Continue her Colace.    CLINICAL SUMMARY: 79 y/o F with,  Mobitz 2 AV block admitted 1/24 for dual chamber pacemaker insertion -bilateral pneumothorax, resolving. DC left chest tube today & FU CXR   ALVA,RAKESH V. [phone]

## 2012-03-18 ENCOUNTER — Inpatient Hospital Stay (HOSPITAL_COMMUNITY): Payer: Medicare Other

## 2012-03-18 DIAGNOSIS — J930 Spontaneous tension pneumothorax: Secondary | ICD-10-CM

## 2012-03-18 MED ORDER — MORPHINE SULFATE 2 MG/ML IJ SOLN
2.0000 mg | INTRAMUSCULAR | Status: DC | PRN
  Administered 2012-03-18: 2 mg via INTRAVENOUS
  Filled 2012-03-18: qty 1

## 2012-03-18 MED ORDER — MORPHINE SULFATE 2 MG/ML IJ SOLN
INTRAMUSCULAR | Status: AC
  Administered 2012-03-18: 2 mg via INTRAVENOUS
  Filled 2012-03-18: qty 1

## 2012-03-18 MED ORDER — MAGNESIUM HYDROXIDE 400 MG/5ML PO SUSP
15.0000 mL | Freq: Every day | ORAL | Status: DC | PRN
  Filled 2012-03-18 (×2): qty 30

## 2012-03-18 NOTE — Progress Notes (Signed)
PULMONARY  / CRITICAL CARE MEDICINE  Name: Robin [name] MRN: [id number] DOB: May 02, 1928    LOS: 8  REFERRING PROVIDER:  Dr. Graciela Husbands  CHIEF COMPLAINT:  Bilateral Pneumothorax  BRIEF PATIENT DESCRIPTION: 79 y/o F with Mobitz 2 AV block with RBBB admitted on 1/24 for dual chamber pacemaker insertion with complication of bilateral pneumothorax. BL CT placed 1/24, right CT replaced for larger on 1/26 2/2 persistent PTX.  LINES / TUBES: 1/24 R CT x 2 >> 1/26, replaced 1/26 (TCTS) >>> 1/24 L CT >>>1/30  CULTURES: 1/26 MRSA Nasal swab PCR >> neg  ANTIBIOTICS: None  SIGNIFICANT EVENTS:  1/24 - Admit for dual chamber pacer, bilateral PTX requiring CT x2 1/26 - Persistent BL PTX with right 20% PTX despite pigtail catheter. 1/26 - Right chest tube replacement (with larger size) 1/27 - Improving PTX 1/28 - left chest tube water sealed. Improving bilateral pneumothorax.   LEVEL OF CARE:  SDU PRIMARY SERVICE:  Cardiology CONSULTANTS:  PCCM CODE STATUS:  Full DIET:  Heart Healthy DVT Px:  Lovenox GI Px:  None   INTERVAL HISTORY:   rt chest tube pain with movement C/o constipation oob to chair afebrile   VITAL SIGNS: Temp:  [97.9 F (36.6 C)-99.8 F (37.7 C)] 97.9 F (36.6 C) (01/31 0800) Pulse Rate:  [74-104] 74  (01/31 0800) Resp:  [16-18] 16  (01/31 0400) BP: (109-135)/(52-69) 123/52 mmHg (01/31 0800) SpO2:  [92 %-97 %] 94 % (01/31 0800) Weight:  [62.914 kg (138 lb 11.2 oz)] 62.914 kg (138 lb 11.2 oz) (01/31 0500)  PHYSICAL EXAMINATION: General:  Elderly female s/p bilateral CT placement Neuro:  AAOx4 pre-procedure, MAE HEENT:  Mm pink/moist, no jvd Cardiovascular:  s1s2 rrr, no m/r/g Lungs:  lungs bilaterally diminished, no air leak on left, rt persists Abdomen:  Round/soft, bsx4 active Musculoskeletal: no acute deform Skin:  Warm/dry, no edema   IMAGING:  pCXR (1/31) - resolved  pneumothoraces.  CT head (1/28) - No acute intracranial findings or  skull fracture.   ASSESSMENT / PLAN:  PULMONARY: A: 1. Bilateral Pneumothorax, iatrogenic - pacemaker placement 1/24. BL CT placed , s/p replacement with larger chest tube 1/26. Air leak on right with coughing 2. Mild LLL ATX  P: - Right CT to water seal - side pore appears out of chest wall on CXR but good resp variation confirming that it is still in pleural space.,  Lt out - O2 to support sats >93% - Incentive spirometry - Pain management - oxycodone/ ultram.  CARDIOVASCULAR A: History of 2:1 AV block, s/p pacemaker implantation 1/24 - CE neg x 3 P: Per cardiology (primary service).  GASTROINTESTINAL A: Constipation - resolving. P:  Continue her Colace.  Milk of magnesia ok   CLINICAL SUMMARY: 79 y/o F with,  Mobitz 2 AV block admitted 1/24 for dual chamber pacemaker insertion -bilateral pneumothorax, resolving. DC'd left chest tube  - TCTS following for right - hence PCCM will sign off Pl call as needed   Robin [name] V. [phone]

## 2012-03-18 NOTE — Progress Notes (Signed)
5 Days Post-Op Procedure(s) (LRB): CHEST TUBE INSERTION (Right) Subjective: R pneumothorax with chest tube- small air leak Will place R CT rto water seal L tube out- ok on cxr  Objective: Vital signs in last 24 hours: Temp:  [97.9 F (36.6 C)-99.8 F (37.7 C)] 97.9 F (36.6 C) (01/31 0800) Pulse Rate:  [74-104] 74  (01/31 0800) Cardiac Rhythm:  [-] Ventricular paced (01/31 0800) Resp:  [16-18] 16  (01/31 0400) BP: (109-135)/(52-69) 123/52 mmHg (01/31 0800) SpO2:  [92 %-97 %] 94 % (01/31 0800) Weight:  [138 lb 11.2 oz (62.914 kg)] 138 lb 11.2 oz (62.914 kg) (01/31 0500)  Hemodynamic parameters for last 24 hours:  stable Intake/Output from previous day: 01/30 0701 - 01/31 0700 In: 640 [P.O.:640] Out: 1812 [Urine:1800; Chest Tube:12] Intake/Output this shift: Total I/O In: 40 [P.O.:40] Out: 0   Small air leak on R  Lab Results: No results found for this basename: WBC:2,HGB:2,HCT:2,PLT:2 in the last 72 hours BMET: No results found for this basename: NA:2,K:2,CL:2,CO2:2,GLUCOSE:2,BUN:2,CREATININE:2,CALCIUM:2 in the last 72 hours  PT/INR: No results found for this basename: LABPROT,INR in the last 72 hours ABG    Component Value Date/Time   PHART 7.361 03/11/2012 2023   HCO3 23.3 03/11/2012 2023   TCO2 25 03/11/2012 2023   ACIDBASEDEF 2.0 03/11/2012 2023   O2SAT 93.0 03/11/2012 2023   CBG (last 3)  No results found for this basename: GLUCAP:3 in the last 72 hours  Assessment/Plan: S/P Procedure(s) (LRB): CHEST TUBE INSERTION (Right) Cont chest tube for R pneumothorax   LOS: 8 days    VAN TRIGT III,Vickee Mormino 03/18/2012

## 2012-03-18 NOTE — Progress Notes (Signed)
Patient Name: Robin [name]      SUBJECTIVE: events noted with r tube to water seal and L tube  removed . She did not sleep well last night. There is also a significant amount of discomfort triggered by her x-ray this morning. She is having no pain currently.  We discussed the issue of a lost glasses and dentures. There are is pending need for possible cataract surgery and tooth extraction. This would prompt the need for a different mold and different prescription. What I have suggested the family consider with that we replace the glasses now as the issue of cataract removal would not happen in the immediate time frame and her spare glasses are not very useful. They are agreeable to considering this. As relates to dentures, the hospital work with them on replacing what was lost at the time frame that is best for the patient.  I reviewed with Mr. Kuipers again the physiology of pacing and the anatomy makes pneumothorax   a known risk.  Other children were not available this morning  Past Medical History  Diagnosis Date  . LBBB (left bundle branch block)-rate related   . Narcolepsy   . Vulva cancer   . Mobitz type 2 second degree AV block     2:1  with RBBB    PHYSICAL EXAM Filed Vitals:   03/17/12 2337 03/18/12 0400 03/18/12 0404 03/18/12 0500  BP: 124/64 121/69    Pulse: 88 81    Temp: 99.8 F (37.7 C)  98.8 F (37.1 C)   TempSrc: Oral  Oral   Resp: 18 16    Height:      Weight:    138 lb 11.2 oz (62.914 kg)  SpO2: 92% 93%      Well developed and nourished in no acute distress HENT normal Neck supple with JVP-flat Clear Regular rate and rhythm, no murmurs or gallops Abd-soft with active BS No Clubbing cyanosis edema Skin-warm and dry A & Oriented  Grossly normal sensory and motor function  TELEMETRY: Reviewed telemetry pt in P-synchronous/ AV  pacing    Intake/Output Summary (Last 24 hours) at 03/18/12 0757 Last data filed at 03/18/12 0600  Gross  per 24 hour  Intake    640 ml  Output   1812 ml  Net  -1172 ml    LABS: Basic Metabolic Panel:  Lab 03/15/12 1610 03/14/12 0455 03/13/12 1350 03/12/12 1208  NA 141 134* 137 132*  K 3.9 3.8 4.7 4.0  CL 104 99 101 97  CO2 27 27 26 25   GLUCOSE 106* 130* 101* 111*  BUN 11 14 12 15   CREATININE 0.53 0.59 0.62 0.79  CALCIUM 9.2 8.4 -- --  MG 2.0 1.9 -- --  PHOS 3.6 3.2 -- --   Cardiac Enzymes:  Basename 03/16/12 0940  CKTOTAL --  CKMB --  CKMBINDEX --  TROPONINI <0.30   CBC:  Lab 03/15/12 0518 03/14/12 0455 03/13/12 1350 03/12/12 1208  WBC 7.2 7.9 9.3 11.9*  NEUTROABS -- -- -- --  HGB 10.9* 10.4* 11.5* 11.5*  HCT 30.8* 30.3* 33.0* 33.1*  MCV 87.7 88.9 89.4 89.5  PLT 214 205 205 231   PROTIME: No results found for this basename: LABPROT:3,INR:3 in the last 72 hours Liver Function Tests: No results found for this basename: AST:2,ALT:2,ALKPHOS:2,BILITOT:2,PROT:2,ALBUMIN:2 in the last 72 hours No results found for this basename: LIPASE:2,AMYLASE:2 in the last 72 hours BNP: BNP (last 3 results)  Basename 03/15/12 0518  PROBNP 297.0  ASSESSMENT AND PLAN:  Patient Active Hospital Problem List: AV heart block (03/10/2012)  Acute on chronic diastolic heart failure (03/11/2012)  Bilateral pneumothoraces (03/11/2012)   I saw doubling of her right-sided chest tube this morning. The left chest tube is out. Await input from CCM and CT surgery.  As noted above, reviewed again the circumstances wherein the pneumothoraces occurred. I also reviewed with them the hospital's intention of addressing the lost glasses and dentures.    Signed, Sherryl Manges MD  03/18/2012

## 2012-03-18 NOTE — Progress Notes (Signed)
Chest tube is out. Still mild air leak and right. Still having difficulty with analgesia even not been effective or causing side effects such as hallucinations. Overall she is improving.

## 2012-03-19 ENCOUNTER — Inpatient Hospital Stay (HOSPITAL_COMMUNITY): Payer: Medicare Other

## 2012-03-19 MED ORDER — ALPRAZOLAM 0.25 MG PO TABS
0.2500 mg | ORAL_TABLET | Freq: Three times a day (TID) | ORAL | Status: DC | PRN
  Administered 2012-03-19: 0.25 mg via ORAL
  Filled 2012-03-19: qty 1

## 2012-03-19 NOTE — Progress Notes (Signed)
Patient Name: Robin [name] Date of Encounter: 03/19/2012    SUBJECTIVE:  Depressed. Right-sided chest discomfort, more severe today and last night been previously. No dyspnea  TELEMETRY:  AV sequential paced: Filed Vitals:   03/18/12 2331 03/19/12 0419 03/19/12 0500 03/19/12 0803  BP:  107/62  105/60  Pulse: 81 70    Temp: 97.6 F (36.4 C) 98.2 F (36.8 C)  97.6 F (36.4 C)  TempSrc: Oral Oral  Oral  Resp: 20 14    Height:      Weight:   62.6 kg (138 lb 0.1 oz)   SpO2: 94% 95%      Intake/Output Summary (Last 24 hours) at 03/19/12 1134 Last data filed at 03/19/12 0800  Gross per 24 hour  Intake    340 ml  Output   1350 ml  Net  -1010 ml    Radiology/Studies:  New apical pneumothorax on right  Physical Exam: Blood pressure 105/60, pulse 70, temperature 97.6 F (36.4 C), temperature source Oral, resp. rate 14, height 4\' 11"  (1.499 m), weight 62.6 kg (138 lb 0.1 oz), SpO2 95.00%. Weight change: -0.314 kg (-11.1 oz)   Clear lung fields. No rub  No paracardial rub or murmur.  No edema.  Pacer site unremarkable to  ASSESSMENT:  1. AV block treated with DDD pacemaker, right subclavian.  2. Bilateral pneumothoraces complicating pacemaker placement with successful left reexpansion and chest tube removal but persistent right chest tube and a new apical pneumothorax on today's chest x-ray.  3. Situational depression  Plan:  The patient wants to try Xanax for anxiety and restlessness  Signed, Lesleigh Noe 03/19/2012, 11:34 AM

## 2012-03-19 NOTE — Progress Notes (Addendum)
                    [address]            [place] [address]          [phone]     6 Days Post-Op Procedure(s) (LRB): CHEST TUBE INSERTION (Right)  Subjective: Having a lot of pain this am.  Micah Flesher for several hours without taking anything overnight.  Otherwise stable.   Objective: Vital signs in last 24 hours: Patient Vitals for the past 24 hrs:  BP Temp Temp src Pulse Resp SpO2 Weight  03/19/12 0803 105/60 mmHg 97.6 F (36.4 C) Oral - - - -  03/19/12 0500 - - - - - - 138 lb 0.1 oz (62.6 kg)  03/19/12 0419 107/62 mmHg 98.2 F (36.8 C) Oral 70  14  95 % -  03/18/12 2331 - 97.6 F (36.4 C) Oral 81  20  94 % -  03/18/12 2029 112/62 mmHg 98.1 F (36.7 C) Oral 99  16  93 % -  03/18/12 1530 120/56 mmHg 98 F (36.7 C) Oral 89  17  93 % -  03/18/12 1200 109/61 mmHg 98.2 F (36.8 C) Oral 79  18  94 % -   Current Weight  03/19/12 138 lb 0.1 oz (62.6 kg)     Intake/Output from previous day: 01/31 0701 - 02/01 0700 In: 260 [P.O.:260] Out: 2130 [Urine:2130]    PHYSICAL EXAM:  Heart: RRR Lungs: Slightly decreased BS in bases Chest tube: no air leak with cough    Lab Results: CBC:No results found for this basename: WBC:2,HGB:2,HCT:2,PLT:2 in the last 72 hours BMET: No results found for this basename: NA:2,K:2,CL:2,CO2:2,GLUCOSE:2,BUN:2,CREATININE:2,CALCIUM:2 in the last 72 hours  PT/INR: No results found for this basename: LABPROT,INR in the last 72 hours    CXR: Findings: Interval development of a small right apical  pneumothorax. The right thoracostomy tube is in similar position  and may have been advanced slightly. The proximal side port is  just within the thorax. Similar appearance of left greater than  right basilar atelectasis with right to left shift of the heart and  mediastinum. Stable right subclavian cardiac rhythm maintenance  device.  IMPRESSION:  1. Interval development of a small right apical pneumothorax.  2. Similar position  of the right thoracostomy tube. It may have  advanced slightly and the proximal side hole is now just within the  thorax.  3. Similar appearance of left greater than right atelectasis  versus consolidation.   Assessment/Plan: S/P Procedure(s) (LRB): CHEST TUBE INSERTION (Right) CXR with new small R apical ptx, but no appreciable air leak.  Will leave CT to water seal for now and watch. Hopefully can d/c CT soon.   LOS: 9 days    COLLINS,GINA H 03/19/2012    Chart reviewed, patient examined, agree with above. She has a tiny right apical ptx that is insignificant and was probably there before. There is no air leak from the tube so it can come out in am and she can go home Monday.

## 2012-03-20 ENCOUNTER — Inpatient Hospital Stay (HOSPITAL_COMMUNITY): Payer: Medicare Other

## 2012-03-20 NOTE — Progress Notes (Addendum)
                    [address]            [place] [address]          [phone]     7 Days Post-Op Procedure(s) (LRB): CHEST TUBE INSERTION (Right)  Subjective: Stable, sore at CT site.   Objective: Vital signs in last 24 hours: Patient Vitals for the past 24 hrs:  BP Temp Temp src Pulse Resp SpO2 Weight  03/20/12 0816 - 98.1 F (36.7 C) Oral - 16  96 % -  03/20/12 0500 - - - - - - 138 lb 14.2 oz (63 kg)  03/20/12 0327 128/55 mmHg 98.5 F (36.9 C) Oral 68  12  93 % -  03/19/12 2334 110/45 mmHg 98.2 F (36.8 C) Oral 71  14  92 % -  03/19/12 1925 108/63 mmHg 98.8 F (37.1 C) Oral 84  16  92 % -  03/19/12 1619 101/46 mmHg 98.3 F (36.8 C) Oral - - 94 % -  03/19/12 1222 - 98.5 F (36.9 C) Oral - - - -  03/19/12 1200 117/52 mmHg - - - - - -   Current Weight  03/20/12 138 lb 14.2 oz (63 kg)     Intake/Output from previous day: 02/01 0701 - 02/02 0700 In: 580 [P.O.:580] Out: 1650 [Urine:1650]    PHYSICAL EXAM:  Heart: RRR, paced Lungs: Clear Chest tube: no air leak    Lab Results: CBC:No results found for this basename: WBC:2,HGB:2,HCT:2,PLT:2 in the last 72 hours BMET: No results found for this basename: NA:2,K:2,CL:2,CO2:2,GLUCOSE:2,BUN:2,CREATININE:2,CALCIUM:2 in the last 72 hours  PT/INR: No results found for this basename: LABPROT,INR in the last 72 hours    QVZ:DGLO right apical pneumothorax with indwelling right  chest tube.  Patchy left lower lobe opacity, atelectasis versus pneumonia, with  possible that the small left pleural effusion. Mild right basilar  opacity, likely atelectasis.  The heart is normal in size.  Right subclavian pacemaker.  IMPRESSION:  Tiny right apical pneumothorax with indwelling right chest tube.    Assessment/Plan: S/P Procedure(s) (LRB): CHEST TUBE INSERTION (Right) CT with no air leak and CXR stable.  Will d/c CT today.   LOS: 10 days    COLLINS,GINA H 03/20/2012    Chart reviewed,  patient examined, agree with above. She could go home tomorrow from our point of view.

## 2012-03-20 NOTE — Progress Notes (Signed)
Right chest tube removed per protocol. Pt tolerated well. Await portable chest xray.

## 2012-03-20 NOTE — Progress Notes (Signed)
Patient Name: Robin [name] Date of Encounter: 03/20/2012    SUBJECTIVE: Numerous complaints around discomfort and inability to sleep. Xanax caused her to have an uncomfortable sensorium with some hallucinations.  TELEMETRY:  AV sequential pacing: Filed Vitals:   03/19/12 2334 03/20/12 0327 03/20/12 0500 03/20/12 0816  BP: 110/45 128/55    Pulse: 71 68    Temp: 98.2 F (36.8 C) 98.5 F (36.9 C)  98.1 F (36.7 C)  TempSrc: Oral Oral  Oral  Resp: 14 12  16   Height:      Weight:   63 kg (138 lb 14.2 oz)   SpO2: 92% 93%  96%    Intake/Output Summary (Last 24 hours) at 03/20/12 1017 Last data filed at 03/20/12 0500  Gross per 24 hour  Intake    340 ml  Output   1650 ml  Net  -1310 ml     Radiology/Studies:  Clinical Data: Right pneumothorax, status post chest tube  PORTABLE CHEST - 1 VIEW  Comparison: 03/19/2012  Findings: Tiny right apical pneumothorax with indwelling right  chest tube.  Patchy left lower lobe opacity, atelectasis versus pneumonia, with  possible that the small left pleural effusion. Mild right basilar  opacity, likely atelectasis.  The heart is normal in size.  Right subclavian pacemaker.  IMPRESSION:  Tiny right apical pneumothorax with indwelling right chest tube.  Original Report Authenticated By: Charline Bills, M.D.   Physical Exam: Blood pressure 128/55, pulse 68, temperature 98.1 F (36.7 C), temperature source Oral, resp. rate 16, height 4\' 11"  (1.499 m), weight 63 kg (138 lb 14.2 oz), SpO2 96.00%. Weight change: 0.4 kg (14.1 oz)   Chest is clear left and right side.  ASSESSMENT:  1. AV block, treated with DDD pacemaker  2. Tiny persistent right apical pneumothorax  3. Insomnia and poor pain control  4. Allergies intolerance   Plan:  1. Stable from cardiac standpoint  2. Chest tube management per cardiothoracic surgery  Signed, Lesleigh Noe 03/20/2012, 10:17 AM

## 2012-03-21 ENCOUNTER — Inpatient Hospital Stay (HOSPITAL_COMMUNITY): Payer: Medicare Other

## 2012-03-21 DIAGNOSIS — J930 Spontaneous tension pneumothorax: Secondary | ICD-10-CM

## 2012-03-21 MED ORDER — POLYETHYLENE GLYCOL 3350 17 G PO PACK
17.0000 g | PACK | Freq: Every day | ORAL | Status: DC
  Administered 2012-03-21: 17 g via ORAL
  Filled 2012-03-21 (×2): qty 1

## 2012-03-21 NOTE — Progress Notes (Signed)
Patient Name: Robin [name] Date of Encounter: 03/21/2012  SUBJECTIVE: Feeling better and there is no dyspnea.  TELEMETRY:   AV seq pacing: Filed Vitals:   03/20/12 2334 03/21/12 0436 03/21/12 0438 03/21/12 0730  BP: 120/55  127/63 130/53  Pulse: 82  89 75  Temp: 98.8 F (37.1 C) 98.5 F (36.9 C)  98.2 F (36.8 C)  TempSrc: Oral Oral  Oral  Resp: 20  18 16   Height:      Weight:  62.1 kg (136 lb 14.5 oz)    SpO2: 92%  93% 94%    Intake/Output Summary (Last 24 hours) at 03/21/12 1610 Last data filed at 03/20/12 2156  Gross per 24 hour  Intake    600 ml  Output   1100 ml  Net   -500 ml    Radiology/Studies:  RADIOLOGY REPORT*  Clinical Data: History pneumothorax. Chest tubes.  CHEST - 2 VIEW  Comparison: Multiple recent previous exams.  Findings: 0802 hours. Two-view exam shows hyperexpansion  consistent with emphysema. The cardiopericardial silhouette is  enlarged. Bibasilar atelectasis noted with tiny bilateral pleural  effusions. No evidence for pneumothorax. Subcutaneous emphysema  seen anteriorly in the chest wall on the lateral film. Tiny  bilateral pleural effusions noted. Right permanent pacer remains  in place. Telemetry leads overlie the chest.  IMPRESSION:  No evidence for pneumothorax.  Emphysema with cardiomegaly.  Bibasilar atelectasis with tiny bilateral pleural effusions.   Physical Exam: Blood pressure 130/53, pulse 75, temperature 98.2 F (36.8 C), temperature source Oral, resp. rate 16, height 4\' 11"  (1.499 m), weight 62.1 kg (136 lb 14.5 oz), SpO2 94.00%. Weight change: -0.9 kg (-1 lb 15.8 oz)   NSR  Chest is clear  Cardiac without rub or murmur  ASSESSMENT:  1. AV block with DDD pacer normally functioning.  2. Bilateral apical Pneumothoraces  Plan:  1. Ambulate.  2. PT   3. Home in AM  Signed, Lesleigh Noe 03/21/2012, 9:18 AM

## 2012-03-21 NOTE — Progress Notes (Signed)
     Patient: Robin [name] Date of Encounter: 03/21/2012, 1:04 PM Admit date: 03/10/2012     Subjective  Ms. Charnley reports persistent soreness but denies SOB. She is eager to work with PT today.   Objective  Physical Exam: Vitals: BP 125/63  Pulse 55  Temp 98.5 F (36.9 C) (Oral)  Resp 18  Ht 4\' 11"  (1.499 m)  Wt 136 lb 14.5 oz (62.1 kg)  BMI 27.65 kg/m2  SpO2 96% General: Well developed, well appearing 79 year old female in no acute distress. Neck: Supple. JVD not elevated. Lungs: Clear bilaterally to auscultation without wheezes, rales, or rhonchi. Breathing is unlabored. Heart: RRR S1 S2 without murmurs, rubs, or gallops.  Abdomen: Soft, non-distended. Extremities: No clubbing or cyanosis. No edema.  Distal pedal pulses are 2+ and equal bilaterally. Neuro: Alert and oriented X 3. Moves all extremities spontaneously. No focal deficits. Skin: Left and right upper chest sites intact with Steri-strips in place. No significant bleeding or hematoma.  Intake/Output:  Intake/Output Summary (Last 24 hours) at 03/21/12 1304 Last data filed at 03/20/12 2156  Gross per 24 hour  Intake    240 ml  Output    700 ml  Net   -460 ml    Inpatient Medications:     . aspirin EC  81 mg Oral Daily  . docusate sodium  100 mg Oral BID  . enoxaparin  40 mg Subcutaneous Q24H    Labs: No results found for this basename: NA:2,K:2,CL:2,CO2:2,GLUCOSE:2,BUN:2,CREATININE:2,CALCIUM:2,MG:2,PHOS:2 in the last 72 hours No results found for this basename: WBC:2,NEUTROABS:2,HGB:2,HCT:2,MCV:2,PLT:2 in the last 72 hours   Radiology/Studies: Dg Chest 2 View  03/21/2012  *RADIOLOGY REPORT*  Clinical Data: History pneumothorax.  Chest tubes.  CHEST - 2 VIEW  Comparison: Multiple recent previous exams.  Findings: 0802 hours.  Two-view exam shows hyperexpansion consistent with emphysema. The cardiopericardial silhouette is enlarged.  Bibasilar atelectasis noted with tiny bilateral pleural  effusions.  No evidence for pneumothorax. Subcutaneous emphysema seen anteriorly in the chest wall on the lateral film.  Tiny bilateral pleural effusions noted.  Right permanent pacer remains in place. Telemetry leads overlie the chest.  IMPRESSION: No evidence for pneumothorax.  Emphysema with cardiomegaly.  Bibasilar atelectasis with tiny bilateral pleural effusions.   Original Report Authenticated By: Kennith Center, M.D.     Telemetry: A synchronous V pacing    Assessment and Plan  1. High grade AV block s/p DDD PPM - Normal device function - Implant site intact without significant bleeding or hematoma 2. Bilateral apical pneumothoraces s/p chest tube placement - d/c'd 03/20/2012 - Follow-up CXR today shows PTX resolved  Signed, EDMISTEN, BROOKE PA-C  Mrs. Walthall is feeling better today. I spoke with her and her daughter about the series of unfortunate events beginning with the bilateral pneumothoraces. They include hallucinations at night, pain, concerns about situational depression, loss glasses and dentures.  Thankfully the chest tube out today and she is feeling somewhat better.

## 2012-03-21 NOTE — Progress Notes (Signed)
8 Days Post-Op Procedure(s) (LRB): CHEST TUBE INSERTION (Right) Subjective: Chest tubes all out No pntx on am CXR  Objective: Vital signs in last 24 hours: Temp:  [98.2 F (36.8 C)-98.8 F (37.1 C)] 98.5 F (36.9 C) (02/03 1053) Pulse Rate:  [55-89] 55  (02/03 1053) Cardiac Rhythm:  [-] Ventricular paced (02/03 0739) Resp:  [16-20] 18  (02/03 1053) BP: (111-130)/(53-63) 125/63 mmHg (02/03 1053) SpO2:  [92 %-96 %] 96 % (02/03 1053) Weight:  [136 lb 14.5 oz (62.1 kg)] 136 lb 14.5 oz (62.1 kg) (02/03 0436)  Hemodynamic parameters for last 24 hours:  stable Intake/Output from previous day: 02/02 0701 - 02/03 0700 In: 840 [P.O.:840] Out: 1100 [Urine:1100] Intake/Output this shift:    Lungs clear bilaterally  Lab Results: No results found for this basename: WBC:2,HGB:2,HCT:2,PLT:2 in the last 72 hours BMET: No results found for this basename: NA:2,K:2,CL:2,CO2:2,GLUCOSE:2,BUN:2,CREATININE:2,CALCIUM:2 in the last 72 hours  PT/INR: No results found for this basename: LABPROT,INR in the last 72 hours ABG    Component Value Date/Time   PHART 7.361 03/11/2012 2023   HCO3 23.3 03/11/2012 2023   TCO2 25 03/11/2012 2023   ACIDBASEDEF 2.0 03/11/2012 2023   O2SAT 93.0 03/11/2012 2023   CBG (last 3)  No results found for this basename: GLUCAP:3 in the last 72 hours  Assessment/Plan: S/P Procedure(s) (LRB): CHEST TUBE INSERTION (Right) Check CXR in am, DC home if OK   Follow up in Office in 2weeks   LOS: 11 days    VAN TRIGT III,PETER 03/21/2012

## 2012-03-22 ENCOUNTER — Inpatient Hospital Stay (HOSPITAL_COMMUNITY): Payer: Medicare Other

## 2012-03-22 MED ORDER — ACETAMINOPHEN 325 MG PO TABS: 650.0000 mg | ORAL_TABLET | Freq: Four times a day (QID) | ORAL | Status: DC | PRN

## 2012-03-22 MED ORDER — ASPIRIN 81 MG PO TBEC: 81.0000 mg | DELAYED_RELEASE_TABLET | Freq: Every day | ORAL | Status: AC

## 2012-03-22 NOTE — Care Management Note (Signed)
    Page 1 of 1   03/22/2012     4:43:52 PM   CARE MANAGEMENT NOTE 03/22/2012  Patient:  Robin [name], Robin [name]   Account Number:  [id number]  Date Initiated:  03/16/2012  Documentation initiated by:  Junius Landa  Subjective/Objective Assessment:   adm w heart block     Action/Plan:   lives alone   Anticipated DC Date:  03/22/2012   Anticipated DC Plan:  HOME/SELF CARE      DC Planning Services  CM consult      Choice offered to / List presented to:             Status of service:  Completed, signed off Medicare Important Message given?   (If response is "NO", the following Medicare IM given date fields will be blank) Date Medicare IM given:   Date Additional Medicare IM given:    Discharge Disposition:  HOME/SELF CARE  Per UR Regulation:  Reviewed for med. necessity/level of care/duration of stay  If discussed at Long Length of Stay Meetings, dates discussed:   03/17/2012    Comments:  03/22/12 Ajna Moors,RN,BSN [phone] PT FOR DC HOME TODAY.  WILL DC HOME WITH DAUGHTER;  THEY DENY ANY HOME NEEDS.  1/29 1016a debbie dowell rn,bsn

## 2012-03-22 NOTE — Evaluation (Addendum)
Physical Therapy Evaluation and D/C Patient Details Name: Robin [name] MRN: [id number] DOB: 07-26-1928 Today's Date: 03/22/2012 Time: 4098-1191 PT Time Calculation (min): 29 min  PT Assessment / Plan / Recommendation Clinical Impression  Pt s/p heart block with DDD pacemaker with PTX after pacemaker placement.  Pt does not need any further PT in hospital or at home.  Functioning independently.  No equipment needs as well.  Will sign off.  Thanks.    PT Assessment  Patent does not need any further PT services    Follow Up Recommendations  No PT follow up                Equipment Recommendations  None recommended by PT               Precautions / Restrictions Precautions Precautions: None Restrictions Weight Bearing Restrictions: No   Pertinent Vitals/Pain VSS, No pain      Mobility  Bed Mobility Bed Mobility: Supine to Sit Supine to Sit: 7: Independent Transfers Transfers: Sit to Stand;Stand to Sit Sit to Stand: 7: Independent Stand to Sit: 7: Independent Ambulation/Gait Ambulation/Gait Assistance: 7: Independent Ambulation Distance (Feet): 100 Feet Assistive device: None Gait Pattern: Within Functional Limits Stairs: No Wheelchair Mobility Wheelchair Mobility: No    PT Goals  N/A  Visit Information  Last PT Received On: 03/22/12 Assistance Needed: +1    Subjective Data  Subjective: "I feel great.  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, advertising Overall Cognitive Status: Appears within functional limits for tasks assessed/performed Arousal/Alertness: Awake/alert Orientation Level: Appears intact for tasks assessed Behavior During Session: [facility] for tasks performed    Extremity/Trunk Assessment Right Lower Extremity Assessment RLE ROM/Strength/Tone: [facility] for tasks assessed Left Lower Extremity Assessment LLE ROM/Strength/Tone: [facility] for tasks assessed Trunk Assessment Trunk Assessment: Normal   Balance Static Standing Balance Single Leg Stance - Right Leg: 15  Single Leg Stance - Left Leg: 15  Dynamic Standing Balance Dynamic Standing - Balance Support: No upper extremity supported;During functional activity Dynamic Standing - Level of Assistance: 7: Independent Dynamic Standing - Balance Activities: Lateral lean/weight shifting;Forward lean/weight shifting;Reaching across midline Dynamic Standing - Comments: Pt got dressed standing up to put on shirt and pants with no assist needed and no LOB.   High Level Balance High Level Balance Activites: Side stepping;Direction changes;Turns;Sudden stops High Level Balance Comments: No LOB with above activites  End of Session PT - End of Session Equipment Utilized During Treatment: Gait belt Activity Tolerance: Patient tolerated treatment well Patient left: in chair;with call bell/phone within reach Nurse Communication: Mobility status       INGOLD,Jorgeluis Gurganus 03/22/2012, 10:09 AM  [facility] [phone] (pager)

## 2012-03-22 NOTE — Progress Notes (Addendum)
[address]            [place] [address]          [phone]     9 Days Post-Op  Procedure(s) (LRB): CHEST TUBE INSERTION (Right) Subjective: Feels well,mild soreness  Objective  Telemetry vpaced  Temp:  [98 F (36.7 C)-98.6 F (37 C)] 98.6 F (37 C) (02/04 0441) Pulse Rate:  [55-99] 75  (02/04 0441) Resp:  [16-20] 16  (02/04 0441) BP: (123-139)/(63-77) 123/63 mmHg (02/04 0441) SpO2:  [94 %-97 %] 95 % (02/04 0441)   Intake/Output Summary (Last 24 hours) at 03/22/12 0737 Last data filed at 03/22/12 0451  Gross per 24 hour  Intake    240 ml  Output   1400 ml  Net  -1160 ml       General appearance: alert Heart: regular rate and rhythm Lungs: clear to auscultation bilaterally  Lab Results: No results found for this basename: NA:2,K:2,CL:2,CO2:2,GLUCOSE:2,BUN:2,CREATININE:2,CALCIUM:2,MG:2,PHOS:2 in the last 72 hours No results found for this basename: AST:2,ALT:2,ALKPHOS:2,BILITOT:2,PROT:2,ALBUMIN:2 in the last 72 hours No results found for this basename: LIPASE:2,AMYLASE:2 in the last 72 hours No results found for this basename: WBC:2,NEUTROABS:2,HGB:2,HCT:2,MCV:2,PLT:2 in the last 72 hours No results found for this basename: CKTOTAL:4,CKMB:4,TROPONINI:4 in the last 72 hours No components found with this basename: POCBNP:3 No results found for this basename: DDIMER in the last 72 hours No results found for this basename: HGBA1C in the last 72 hours No results found for this basename: CHOL,HDL,LDLCALC,TRIG,CHOLHDL in the last 72 hours No results found for this basename: TSH,T4TOTAL,FREET3,T3FREE,THYROIDAB in the last 72 hours No results found for this basename: VITAMINB12,FOLATE,FERRITIN,TIBC,IRON,RETICCTPCT in the last 72 hours  Medications: Scheduled    . aspirin EC  81 mg Oral Daily  . docusate sodium  100 mg Oral BID  . enoxaparin  40 mg Subcutaneous Q24H  . polyethylene glycol  17 g Oral Daily     Radiology/Studies:    Dg Chest 2 View  03/21/2012  *RADIOLOGY REPORT*  Clinical Data: History pneumothorax.  Chest tubes.  CHEST - 2 VIEW  Comparison: Multiple recent previous exams.  Findings: 0802 hours.  Two-view exam shows hyperexpansion consistent with emphysema. The cardiopericardial silhouette is enlarged.  Bibasilar atelectasis noted with tiny bilateral pleural effusions.  No evidence for pneumothorax. Subcutaneous emphysema seen anteriorly in the chest wall on the lateral film.  Tiny bilateral pleural effusions noted.  Right permanent pacer remains in place. Telemetry leads overlie the chest.  IMPRESSION: No evidence for pneumothorax.  Emphysema with cardiomegaly.  Bibasilar atelectasis with tiny bilateral pleural effusions.   Original Report Authenticated By: Kennith Center, M.D.    Dg Chest Port 1 View  03/20/2012  *RADIOLOGY REPORT*  Clinical Data: Post right-sided chest tube removal, evaluate for pneumothorax  PORTABLE CHEST - 1 VIEW  Comparison: 03/20/2012; 03/19/2012; 03/18/2012  Findings:  Grossly unchanged cardiac silhouette and mediastinal contours. Interval removal of apically directed right-sided chest tube.  No definite pneumothorax.  Grossly unchanged small bilateral pleural effusions with bibasilar opacities, left greater than right.  No new focal airspace opacity.  No definite evidence of pulmonary edema.  Unchanged bones.  IMPRESSION: 1.   No definite pneumothorax post removal of right-sided chest tube. 2.  Unchanged small bilateral effusions and bibasilar opacities, left greater than right, atelectasis versus infiltrate.   Original Report Authenticated By: Tacey Ruiz, MD     INR: Will add  last result for INR, ABG once components are confirmed Will add last 4 CBG results once components are confirmed  Assessment/Plan: S/P Procedure(s) (LRB): CHEST TUBE INSERTION (Right)  1 CXR is stable in appearance, can be D/C'd when primary service ready.    LOS: 12 days    GOLD,WAYNE E 2/4/20147:37  AM    Return to office for chest tube suture removal patient examined and medical record reviewed,agree with above note. VAN TRIGT III,Hanako Tipping 03/22/2012

## 2012-03-22 NOTE — Discharge Summary (Addendum)
Patient ID: Fletcher Rathbun MRN: [id number] DOB/AGE: 1928-11-20 79 y.o.  Admit date: 03/10/2012 Discharge date: 03/22/2012  Patient Active Problem List  Diagnosis  . Second degree AV block, Mobitz type II  . Acute on chronic diastolic heart failure  . Bilateral pneumothoraces  . S/P cardiac pacemaker procedure   Primary Discharge Diagnosis : Second-degree heart block with symptoms treated with DDD pacemaker Secondary Discharge Diagnosis : Acute diastolic heart failure due to bradycardia, resolved after pacemaker  Bilateral pneumothoraces, complication of pacemaker insertion  DDD pacemaker    Significant Diagnostic Studies: Echocardiogram  Consults: Dr. Berton Mount, Electrophysiology  Dr. Kathlee Nations Tright, cardiothoracic surgery.  Hospital Course:  The patient was admitted to the hospital because of dizziness and weakness, and a baseline EKG demonstrating second-degree heart block. Electrophysiology was consulted. Dr. Berton Mount placed a pacemaker on 03/11/2012 which was palpated by bilateral pneumothoraces.  Dr. Kathlee Nations Trigt was consult and and bilateral chest tubes were placed. The patient remained relatively immobile until resolution of the pneumothoraces. The last chest tube was removed on 03/20/2012. She was discharged in improved condition after having been evaluated by physical therapy and given a plan for progression.     Discharge Exam: Blood pressure 123/63, pulse 75, temperature 98.6 F (37 C), temperature source Oral, resp. rate 16, height 4\' 11"  (1.499 m), weight 62.1 kg (136 lb 14.5 oz), SpO2 95.00%.   Lungs are well expanded on exam. No rales or wheezes are heard.  Cardiac exam reveals no rub.  Extremities reveal no edema Labs:   Lab Results  Component Value Date   WBC 7.2 03/15/2012   HGB 10.9* 03/15/2012   HCT 30.8* 03/15/2012   MCV 87.7 03/15/2012   PLT 214 03/15/2012   No results found for this basename:  NA,K,CL,CO2,BUN,CREATININE,CALCIUM,LABALBU,PROT,BILITOT,ALKPHOS,ALT,AST,GLUCOSE in the last 168 hours Lab Results  Component Value Date   TROPONINI <0.30 03/16/2012     Radiology: RADIOLOGY REPORT*  Clinical Data: Recent pneumothorax post chest tube  CHEST - 2 VIEW  Comparison: 03/21/2012  Findings:  Right subclavian sequential transveous pacemaker leads project at  right atrium and right ventricle.  Minimal enlargement of cardiac silhouette.  Mediastinal contours and pulmonary vascularity normal.  Emphysematous changes of bibasilar atelectasis and minimal pleural  effusions.  No definite acute infiltrate or pneumothorax.  Diffuse osseous mineralization.  Soft tissue gas at anterior inferior chest wall is likely related  to prior chest tube placement.  IMPRESSION:  Changes of COPD with minimal bibasilar atelectasis and effusions.  Original Report Authenticated By: Ulyses Southward, M.D.   EKG: AV sequential pacing  FOLLOW UP PLANS AND APPOINTMENTS    Medication List     As of 03/22/2012  9:27 AM    TAKE these medications         acetaminophen 325 MG tablet   Commonly known as: TYLENOL   Take 2 tablets (650 mg total) by mouth every 6 (six) hours as needed (or Fever >/= 101).      aspirin 81 MG EC tablet   Take 1 tablet (81 mg total) by mouth daily.      docusate sodium 100 MG capsule   Commonly known as: COLACE   Take 100 mg by mouth daily as needed. constipation      Echinacea 125 MG Caps   Take 1 capsule by mouth daily.           Follow-up Information    Follow up with Lesleigh Noe, MD. On 03/29/2012. (  11:30 AM)    Contact information:   [address] [place] [address] [phone]       Follow up with VAN Dinah Beers, MD. In 2 weeks.   Contact information:   [address] [employer] [address] [place] [address] [phone]       Follow up with [facility]. In 2 weeks. (Please get CXR 1 hour prior to appointment)     Contact information:   [place]          BRING ALL MEDICATIONS WITH YOU TO FOLLOW UP APPOINTMENTS  Time spent with patient to include physician time: 30 minutes Signed: Lesleigh Noe 03/22/2012, 9:27 AM

## 2012-03-25 ENCOUNTER — Other Ambulatory Visit: Payer: Self-pay | Admitting: *Deleted

## 2012-03-25 DIAGNOSIS — J9383 Other pneumothorax: Secondary | ICD-10-CM

## 2012-03-28 ENCOUNTER — Telehealth: Payer: Self-pay | Admitting: *Deleted

## 2012-03-28 NOTE — Telephone Encounter (Signed)
No antibiotidcs are indicated because of chest tube.  3-4 weeks is probably reasonable if pt needs to have oral surgery; if it is just being fitted for dentures, no worries

## 2012-03-28 NOTE — Telephone Encounter (Signed)
Received call from [facility] at Dr. Marzetta Board office.  [phone]). Pt is in office for dental exam today. Will need to have dentures made.  Olegario Messier is asking if pt needs antibiotics prior to dental exam.  Pt had pacemaker inserted on March 11, 2012. I told Olegario Messier will need antibiotics for 6 weeks from date of pacer insertion.  Pt is allergic to PCN.  Exam will be cancelled for today.  Olegario Messier would like to know if pt will need antibiotics longer than 6 weeks due to having chest tube inserted while in hospital.

## 2012-03-29 ENCOUNTER — Ambulatory Visit (INDEPENDENT_AMBULATORY_CARE_PROVIDER_SITE_OTHER): Payer: Medicare Other

## 2012-03-29 DIAGNOSIS — T82897A Other specified complication of cardiac prosthetic devices, implants and grafts, initial encounter: Secondary | ICD-10-CM | POA: Diagnosis not present

## 2012-03-29 DIAGNOSIS — J9383 Other pneumothorax: Secondary | ICD-10-CM | POA: Diagnosis not present

## 2012-03-29 DIAGNOSIS — D381 Neoplasm of uncertain behavior of trachea, bronchus and lung: Secondary | ICD-10-CM

## 2012-03-29 DIAGNOSIS — Z950 Presence of cardiac pacemaker: Secondary | ICD-10-CM | POA: Diagnosis not present

## 2012-03-29 DIAGNOSIS — I4430 Unspecified atrioventricular block: Secondary | ICD-10-CM | POA: Diagnosis not present

## 2012-03-29 DIAGNOSIS — Z4802 Encounter for removal of sutures: Secondary | ICD-10-CM

## 2012-03-29 NOTE — Progress Notes (Signed)
Removed 2 sutures from chest tube sites on the left and 1 suture on the right, no signs of infection and pt tolerated well.

## 2012-04-06 ENCOUNTER — Ambulatory Visit
Admission: RE | Admit: 2012-04-06 | Discharge: 2012-04-06 | Disposition: A | Discharge status: Home / Self Care | Payer: Medicare Other | Source: Ambulatory Visit | Attending: Cardiothoracic Surgery | Admitting: Cardiothoracic Surgery

## 2012-04-06 ENCOUNTER — Ambulatory Visit (INDEPENDENT_AMBULATORY_CARE_PROVIDER_SITE_OTHER): Payer: Medicare Other | Admitting: Cardiothoracic Surgery

## 2012-04-06 ENCOUNTER — Encounter: Payer: Self-pay | Admitting: Cardiothoracic Surgery

## 2012-04-06 ENCOUNTER — Telehealth: Payer: Self-pay | Admitting: Internal Medicine

## 2012-04-06 VITALS — BP 147/88 | HR 95 | Resp 20 | Ht 59.0 in | Wt 135.0 lb

## 2012-04-06 DIAGNOSIS — J9383 Other pneumothorax: Secondary | ICD-10-CM

## 2012-04-06 DIAGNOSIS — J939 Pneumothorax, unspecified: Secondary | ICD-10-CM

## 2012-04-06 DIAGNOSIS — Z09 Encounter for follow-up examination after completed treatment for conditions other than malignant neoplasm: Secondary | ICD-10-CM

## 2012-04-06 DIAGNOSIS — J90 Pleural effusion, not elsewhere classified: Secondary | ICD-10-CM | POA: Diagnosis not present

## 2012-04-06 NOTE — Telephone Encounter (Addendum)
03-17-12 441pm n/a unable to leave message, calling to set up pacer wound ck 03-22-12 1046am n/a 04-06-12 214pm pt will talk to dtr and see when she can come in and call back today or tomorrow, has another appt this pm and may get back to late/mt 04-15-12 per gesila pt called to say she just saw dr Verdis Prime last week and had wound and device check and will continue follow up with him/mt

## 2012-04-06 NOTE — Progress Notes (Signed)
PCP is No primary provider on file. Referring Provider is Merwyn Katos, MD  Chief Complaint  Patient presents with  . Spontaneous Pneumothorax    2 wk f/u with cxr    HPI: First office visit after prolonged hospitalization. Patient required permanent pacemaker for bradycardia and subsequently developed bilateral pneumothoraces requiring bilateral chest tubes. Pneumothoraces resolved with conservative management and chest tubes removed prior to discharge. Chest tube sites healed and chest x-ray shows no recurrent pneumothorax with clear lung fields and without significant pleural effusion.  Patient's main complaints are related to her generalized deconditioning and weakness from a prolonged hospitalization. She is back to living alone. She has not yet rate to drive. She had several questions.  Past Medical History  Diagnosis Date  . LBBB (left bundle branch block)-rate related   . Narcolepsy   . Vulva cancer   . Mobitz type 2 second degree AV block     2:1  with RBBB    Past Surgical History  Procedure Laterality Date  . Wrist fracture surgery    . Hernia repair    . Mastoidectomy revision    . Tonsillectomy    . Abdominal surgery      exploratory  . Abdominal hysterectomy    . Bowel resection    . Chest tube insertion  03/13/2012    Procedure: CHEST TUBE INSERTION;  Surgeon: Kerin Perna, MD;  Location: [facility] OR;  Service: Thoracic;  Laterality: Right;    Family History  Problem Relation Age of Onset  . Heart attack Mother   . Cancer Father     Social History History  Substance Use Topics  . Smoking status: Never Smoker   . Smokeless tobacco: Never Used  . Alcohol Use: Yes     Comment: seldom    Current Outpatient Prescriptions  Medication Sig Dispense Refill  . acetaminophen (TYLENOL) 325 MG tablet Take 2 tablets (650 mg total) by mouth every 6 (six) hours as needed (or Fever >/= 101).      Marland Kitchen aspirin EC 81 MG EC tablet Take 1 tablet (81 mg total) by mouth  daily.      . clindamycin (CLEOCIN) 150 MG capsule Take 150 mg by mouth 4 (four) times daily.      Marland Kitchen docusate sodium (COLACE) 100 MG capsule Take 100 mg by mouth daily as needed. constipation      . Echinacea 125 MG CAPS Take 1 capsule by mouth daily.      . metroNIDAZOLE (FLAGYL) 500 MG tablet Take 500 mg by mouth 3 (three) times daily.       No current facility-administered medications for this visit.    Allergies  Allergen Reactions  . Influenza A (H1n1) Monoval Pf   . Penicillins Hives and Rash    Review of Systems no fever or productive cough  BP 147/88  Pulse 95  Resp 20  Ht 4\' 11"  (1.499 m)  Wt 135 lb (61.236 kg)  BMI 27.25 kg/m2  SpO2 95% Physical Exam Alert and comfortable Breath sounds clear and equal Bilateral chest tube incision is well-healed Heart rhythm regular  Diagnostic Tests: Chest x-ray shows clear lung fields no pneumothorax pacemaker appears to be in good position  Impression: Good progression after prolonged hospitalization for bilateral pneumothoraces. No significant current pulmonary complaints. Patient on room air  Plan: Exercise plan discussed with patient and daughter for treatment of her deconditioning. Lifting restrictions also discussed. Will return in 3 weeks with chest x-ray to review situation.

## 2012-04-12 NOTE — Telephone Encounter (Signed)
I spoke with Page at Dr. Marzetta Board and made her aware of recommendations.

## 2012-04-20 ENCOUNTER — Ambulatory Visit: Payer: Medicare Other

## 2012-04-26 DIAGNOSIS — I4430 Unspecified atrioventricular block: Secondary | ICD-10-CM | POA: Diagnosis not present

## 2012-04-26 DIAGNOSIS — J9383 Other pneumothorax: Secondary | ICD-10-CM | POA: Diagnosis not present

## 2012-04-26 DIAGNOSIS — Z950 Presence of cardiac pacemaker: Secondary | ICD-10-CM | POA: Diagnosis not present

## 2012-04-27 ENCOUNTER — Ambulatory Visit: Payer: Medicare Other | Admitting: Cardiothoracic Surgery

## 2012-04-27 ENCOUNTER — Other Ambulatory Visit: Payer: Self-pay

## 2012-04-27 ENCOUNTER — Ambulatory Visit
Admission: RE | Admit: 2012-04-27 | Discharge: 2012-04-27 | Disposition: A | Discharge status: Home / Self Care | Payer: Medicare Other | Source: Ambulatory Visit | Attending: Cardiothoracic Surgery | Admitting: Cardiothoracic Surgery

## 2012-04-27 ENCOUNTER — Encounter: Payer: Self-pay | Admitting: Cardiothoracic Surgery

## 2012-04-27 ENCOUNTER — Ambulatory Visit (INDEPENDENT_AMBULATORY_CARE_PROVIDER_SITE_OTHER): Payer: Medicare Other | Admitting: Cardiothoracic Surgery

## 2012-04-27 VITALS — BP 158/84 | HR 92 | Resp 20 | Ht 59.0 in | Wt 135.0 lb

## 2012-04-27 DIAGNOSIS — Z9889 Other specified postprocedural states: Secondary | ICD-10-CM

## 2012-04-27 DIAGNOSIS — Z09 Encounter for follow-up examination after completed treatment for conditions other than malignant neoplasm: Secondary | ICD-10-CM

## 2012-04-27 DIAGNOSIS — J9383 Other pneumothorax: Secondary | ICD-10-CM | POA: Diagnosis not present

## 2012-04-27 DIAGNOSIS — J939 Pneumothorax, unspecified: Secondary | ICD-10-CM

## 2012-04-27 DIAGNOSIS — R079 Chest pain, unspecified: Secondary | ICD-10-CM | POA: Diagnosis not present

## 2012-04-27 DIAGNOSIS — I2510 Atherosclerotic heart disease of native coronary artery without angina pectoris: Secondary | ICD-10-CM

## 2012-04-27 NOTE — Progress Notes (Signed)
PCP is No primary provider on file. Referring Provider is No ref. provider found  Chief Complaint  Patient presents with  . Follow-up    4 week f/u with CXR for bilateral pneumothoraces    HPI: Routine office followup after her bilateral chest tube placement for iatrogenic pneumothorax when she had a urgent transvenous pacemaker placed for bradycardia. Chest x-ray today shows complete resolution of pneumothorax. No pleural effusion. No abnormalities.  Patient still experiencing some right sided neuritic pain from chest tube placement-she needed 2 chest tubes to reexpand the right lung. She does not wish pain medication or Neurontin. She was reassured that this pain does not indicate that there is anything wrong inside her thorax.    Past Medical History  Diagnosis Date  . LBBB (left bundle branch block)-rate related   . Narcolepsy   . Vulva cancer   . Mobitz type 2 second degree AV block     2:1  with RBBB    Past Surgical History  Procedure Laterality Date  . Wrist fracture surgery    . Hernia repair    . Mastoidectomy revision    . Tonsillectomy    . Abdominal surgery      exploratory  . Abdominal hysterectomy    . Bowel resection    . Chest tube insertion  03/13/2012    Procedure: CHEST TUBE INSERTION;  Surgeon: Kerin Perna, MD;  Location: [facility] OR;  Service: Thoracic;  Laterality: Right;    Family History  Problem Relation Age of Onset  . Heart attack Mother   . Cancer Father     Social History History  Substance Use Topics  . Smoking status: Never Smoker   . Smokeless tobacco: Never Used  . Alcohol Use: Yes     Comment: seldom    Current Outpatient Prescriptions  Medication Sig Dispense Refill  . acetaminophen (TYLENOL) 325 MG tablet Take 2 tablets (650 mg total) by mouth every 6 (six) hours as needed (or Fever >/= 101).      Marland Kitchen aspirin EC 81 MG EC tablet Take 1 tablet (81 mg total) by mouth daily.      Marland Kitchen docusate sodium (COLACE) 100 MG capsule Take 100 mg  by mouth daily as needed. constipation       No current facility-administered medications for this visit.    Allergies  Allergen Reactions  . Influenza A (H1n1) Monoval Pf   . Penicillins Hives and Rash    Review of Systems  BP 158/84  Pulse 92  Resp 20  Ht 4\' 11"  (1.499 m)  Wt 135 lb (61.236 kg)  BMI 27.25 kg/m2  SpO2 96% Physical Exam  breath sounds clear and equal   cardiac rhythm regular Chest tube sites well-healed  Diagnostic Tests:  Chest x-ray shows resolution of pneumothorax Impres    Resolution of pneumothoraces                   Return as needed, no restrictions to activity   Plan:

## 2012-08-01 DIAGNOSIS — I4430 Unspecified atrioventricular block: Secondary | ICD-10-CM | POA: Diagnosis not present

## 2012-08-01 DIAGNOSIS — Z950 Presence of cardiac pacemaker: Secondary | ICD-10-CM | POA: Diagnosis not present

## 2012-08-02 DIAGNOSIS — J9383 Other pneumothorax: Secondary | ICD-10-CM | POA: Diagnosis not present

## 2012-08-02 DIAGNOSIS — R030 Elevated blood-pressure reading, without diagnosis of hypertension: Secondary | ICD-10-CM | POA: Diagnosis not present

## 2012-08-02 DIAGNOSIS — I4430 Unspecified atrioventricular block: Secondary | ICD-10-CM | POA: Diagnosis not present

## 2012-08-02 DIAGNOSIS — R079 Chest pain, unspecified: Secondary | ICD-10-CM | POA: Diagnosis not present

## 2012-08-02 DIAGNOSIS — Z950 Presence of cardiac pacemaker: Secondary | ICD-10-CM | POA: Diagnosis not present

## 2012-09-15 DIAGNOSIS — C519 Malignant neoplasm of vulva, unspecified: Secondary | ICD-10-CM | POA: Diagnosis not present

## 2012-09-21 ENCOUNTER — Other Ambulatory Visit: Payer: Self-pay

## 2012-11-18 ENCOUNTER — Ambulatory Visit (INDEPENDENT_AMBULATORY_CARE_PROVIDER_SITE_OTHER): Payer: Medicare Other | Admitting: *Deleted

## 2012-11-18 ENCOUNTER — Telehealth: Payer: Self-pay | Admitting: *Deleted

## 2012-11-18 DIAGNOSIS — I441 Atrioventricular block, second degree: Secondary | ICD-10-CM

## 2012-11-18 DIAGNOSIS — IMO0002 Reserved for concepts with insufficient information to code with codable children: Secondary | ICD-10-CM | POA: Diagnosis not present

## 2012-11-18 LAB — PACEMAKER DEVICE OBSERVATION
AL AMPLITUDE: 4 mv
AL THRESHOLD: 0.75 V
DEVICE MODEL PM: 7431184
RV LEAD AMPLITUDE: 8.4 mv
RV LEAD IMPEDENCE PM: 637.5 Ohm
RV LEAD THRESHOLD: 0.5 V

## 2012-11-18 NOTE — Telephone Encounter (Signed)
Patient was walking into [facility] office around 1:15pm. She states she fell on the sidewalk right outside the entryway. She states there was no debris or object that caused her fall. She denies any symptomology that caused her fall (no dizziness, no headache, no weakness, no loss of balance). She states she did not trip and does not know why she fell down. States she landed on her left forearm. She has a metal plate in her left wrist from surgery three years ago. Other visitors saw her fall, helped her up and escorted her into the office. Triage Nurse, Shela Nevin, assessed patient and took her BP (130/85), HR 88 regular around 1:30pm. Patient states that is the best BP she has had in a long time. She was oriented to time, place, person. She states her left forearm is painful (8/10) scale. She is able to move her arm but is holding it with her other arm to support it due to the pain. She is able to wiggle her fingers in her left hand without referred pain, however when she moves/bends her left wrist she is getting a shooting pain to the upper forearm left side. There are no abrasions, broken skin, bruising, discolorations or swelling to her left arm, right arm or her legs. She denies tingling or weakness of the left arm/hand. She denies any pain to any other extremity or site. Right arm is without any noted abnormalities. She denies hitting her head. She states that her elbow does not really hurt but just below it. There is tenderness to touch just below the antecubital site. When asked to move her shoulder, she states she is able to do that without pain or discomfort.  Dr. Clifton James, Cardiologist, came in to assess the patient. He examined her and advised her to go to Urgent Care or her Orthopedist to be seen today for follow up given her surgical history with the wrist plate in the affected arm. Patient states she can drive okay but her family is out of town this week and she is anxious just in case  something is wrong with her arm. Triage Nurse called [facility], Dr. Orlan Leavens, to inform them of the patient's fall and resulting pain. Dr. Orlan Leavens states that he will see patient if she can get there before 3:00pm today. Triage Nurse provided directions to patient from our office to Dr. Bari Edward office. Patient completed her Device check here at the Cardiology office and left at 2:10pm to proceed to Dr. Bari Edward office. Patient advised to call us back should she need anything further from our office. Patient voiced anxiety over possible injury given that her family was out of town. Patient voiced appreciation that Cardiology staff assisted her and that Dr. Clifton James checked her out and that the phone call was made to her Orthopedist to get her seen today and that directions were printed out for her to get to [facility] from our office.

## 2012-11-18 NOTE — Progress Notes (Signed)
Pacemaker check in clinic. Normal device function. Thresholds, sensing, impedances consistent with previous measurements. Device programmed to maximize longevity. 4 mode switch, the longest 1:28 minutes, - coumadin, shows atrial tachy.  No  high ventricular rates noted. Device programmed at appropriate safety margins. Histogram distribution appropriate for patient activity level. Device programmed to optimize intrinsic conduction. Estimated longevity 8.3 years. Patient enrolled in remote follow-up/TTM's with [employer]. Plan to follow every 3 months remotely and see annually in office. Patient education completed.  ROV in January with Dr. Ladona Ridgel.

## 2012-12-01 DIAGNOSIS — IMO0002 Reserved for concepts with insufficient information to code with codable children: Secondary | ICD-10-CM | POA: Diagnosis not present

## 2012-12-07 ENCOUNTER — Encounter: Payer: Self-pay | Admitting: Internal Medicine

## 2012-12-22 ENCOUNTER — Other Ambulatory Visit: Payer: Self-pay

## 2012-12-26 ENCOUNTER — Telehealth: Payer: Self-pay | Admitting: Interventional Cardiology

## 2012-12-26 ENCOUNTER — Other Ambulatory Visit: Payer: Self-pay | Admitting: *Deleted

## 2012-12-26 ENCOUNTER — Encounter: Payer: Self-pay | Admitting: Nurse Practitioner

## 2012-12-26 ENCOUNTER — Encounter (HOSPITAL_COMMUNITY): Payer: Self-pay

## 2012-12-26 ENCOUNTER — Ambulatory Visit (INDEPENDENT_AMBULATORY_CARE_PROVIDER_SITE_OTHER): Payer: Medicare Other | Admitting: Nurse Practitioner

## 2012-12-26 ENCOUNTER — Ambulatory Visit (HOSPITAL_COMMUNITY)
Admission: RE | Admit: 2012-12-26 | Discharge: 2012-12-26 | Disposition: A | Discharge status: Home / Self Care | Payer: Medicare Other | Source: Ambulatory Visit | Attending: Nurse Practitioner | Admitting: Nurse Practitioner

## 2012-12-26 ENCOUNTER — Telehealth: Payer: Self-pay | Admitting: *Deleted

## 2012-12-26 ENCOUNTER — Ambulatory Visit
Admission: RE | Admit: 2012-12-26 | Discharge: 2012-12-26 | Disposition: A | Discharge status: Home / Self Care | Payer: Medicare Other | Source: Ambulatory Visit | Attending: Nurse Practitioner | Admitting: Nurse Practitioner

## 2012-12-26 VITALS — BP 140/90 | HR 118 | Ht 59.0 in | Wt 137.8 lb

## 2012-12-26 DIAGNOSIS — R799 Abnormal finding of blood chemistry, unspecified: Secondary | ICD-10-CM | POA: Insufficient documentation

## 2012-12-26 DIAGNOSIS — R0989 Other specified symptoms and signs involving the circulatory and respiratory systems: Secondary | ICD-10-CM | POA: Diagnosis not present

## 2012-12-26 DIAGNOSIS — R000 Tachycardia, unspecified: Secondary | ICD-10-CM | POA: Diagnosis not present

## 2012-12-26 DIAGNOSIS — R0600 Dyspnea, unspecified: Secondary | ICD-10-CM

## 2012-12-26 DIAGNOSIS — R0602 Shortness of breath: Secondary | ICD-10-CM | POA: Diagnosis not present

## 2012-12-26 DIAGNOSIS — J449 Chronic obstructive pulmonary disease, unspecified: Secondary | ICD-10-CM | POA: Insufficient documentation

## 2012-12-26 DIAGNOSIS — R0609 Other forms of dyspnea: Secondary | ICD-10-CM

## 2012-12-26 DIAGNOSIS — J4489 Other specified chronic obstructive pulmonary disease: Secondary | ICD-10-CM | POA: Insufficient documentation

## 2012-12-26 DIAGNOSIS — J9819 Other pulmonary collapse: Secondary | ICD-10-CM | POA: Diagnosis not present

## 2012-12-26 DIAGNOSIS — R7989 Other specified abnormal findings of blood chemistry: Secondary | ICD-10-CM

## 2012-12-26 LAB — CBC WITH DIFFERENTIAL/PLATELET
Basophils Absolute: 0 10*3/uL (ref 0.0–0.1)
Basophils Relative: 0.3 % (ref 0.0–3.0)
Eosinophils Absolute: 0.1 10*3/uL (ref 0.0–0.7)
Eosinophils Relative: 2 % (ref 0.0–5.0)
HCT: 40.3 % (ref 36.0–46.0)
Hemoglobin: 13.5 g/dL (ref 12.0–15.0)
Lymphocytes Relative: 29.6 % (ref 12.0–46.0)
Lymphs Abs: 2.2 10*3/uL (ref 0.7–4.0)
MCHC: 33.6 g/dL (ref 30.0–36.0)
MCV: 89.7 fl (ref 78.0–100.0)
Monocytes Absolute: 0.6 10*3/uL (ref 0.1–1.0)
Monocytes Relative: 8 % (ref 3.0–12.0)
Neutro Abs: 4.4 10*3/uL (ref 1.4–7.7)
Neutrophils Relative %: 60.1 % (ref 43.0–77.0)
Platelets: 282 10*3/uL (ref 150.0–400.0)
RBC: 4.49 Mil/uL (ref 3.87–5.11)
RDW: 13.3 % (ref 11.5–14.6)
WBC: 7.4 10*3/uL (ref 4.5–10.5)

## 2012-12-26 LAB — BASIC METABOLIC PANEL
BUN: 10 mg/dL (ref 6–23)
CO2: 28 mEq/L (ref 19–32)
Calcium: 9.6 mg/dL (ref 8.4–10.5)
Chloride: 104 mEq/L (ref 96–112)
Creatinine, Ser: 0.8 mg/dL (ref 0.4–1.2)
GFR: 75.16 mL/min (ref >60.00)
Glucose, Bld: 136 mg/dL — ABNORMAL HIGH (ref 70–99)
Potassium: 4.2 mEq/L (ref 3.5–5.1)
Sodium: 141 mEq/L (ref 135–145)

## 2012-12-26 LAB — URINALYSIS, ROUTINE W REFLEX MICROSCOPIC
Bilirubin Urine: NEGATIVE
Hgb urine dipstick: NEGATIVE
Ketones, ur: NEGATIVE
Nitrite: NEGATIVE
RBC / HPF: NONE SEEN (ref 0–?)
Specific Gravity, Urine: 1.01 (ref 1.000–1.030)
Total Protein, Urine: NEGATIVE
Urine Glucose: NEGATIVE
Urobilinogen, UA: 0.2 (ref 0.0–1.0)
pH: 7 (ref 5.0–8.0)

## 2012-12-26 LAB — D-DIMER, QUANTITATIVE: D-Dimer, Quant: 0.86 ug/mL-FEU — ABNORMAL HIGH (ref 0.00–0.48)

## 2012-12-26 LAB — TSH: TSH: 1.58 u[IU]/mL (ref 0.35–5.50)

## 2012-12-26 MED ORDER — METOPROLOL TARTRATE 25 MG PO TABS: 12.5000 mg | ORAL_TABLET | Freq: Two times a day (BID) | ORAL | Status: DC

## 2012-12-26 MED ORDER — IOHEXOL 350 MG/ML SOLN
80.0000 mL | Freq: Once | INTRAVENOUS | Status: AC | PRN
  Administered 2012-12-26: 40 mL via INTRAVENOUS

## 2012-12-26 NOTE — Progress Notes (Signed)
Robin [name] Date of Birth: 1928/03/22 Medical Record #[id number]  History of Present Illness: Robin [name] is seen back today for a work in visit. Seen for Dr. 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 Other issues include narcolepsy and HTN.  Last seen by Dr. Katrinka Blazing back in June - BP was up - she was resistant to the idea of HTN as a diagnosis apparently.   Comes back today. Here with her daughter. She was coming into our office on October 3rd - fell in the parking lot as she was coming into the building. Landed on her left wrist and on her knees - had her pacemaker checked - looked like she has had some mode switches. She did not trip, pass out, slip etc. She was aware that she was falling. Since that time, has gotten more short of breath. Feels shaky. Heart beating fast. Not checking her BP at home. No cough. No fever. No chills. No hemoptysis. Getting winded much more quickly. Very exhausted as well.   Current Outpatient Prescriptions  Medication Sig Dispense Refill  . acetaminophen (TYLENOL) 325 MG tablet Take 650 mg by mouth every 6 (six) hours as needed.      Marland Kitchen aspirin EC 81 MG EC tablet Take 1 tablet (81 mg total) by mouth daily.       No current facility-administered medications for this visit.    Allergies  Allergen Reactions  . Influenza A (H1n1) Monoval Pf   . Penicillins Hives and Rash    Past Medical History  Diagnosis Date  . LBBB (left bundle branch block)-rate related   . Narcolepsy   . Vulva cancer   . Mobitz type 2 second degree AV block     2:1  with RBBB    Past Surgical History  Procedure Laterality Date  . Wrist fracture surgery    . Hernia repair    . Mastoidectomy revision    . Tonsillectomy    . Abdominal surgery      exploratory  . Abdominal hysterectomy    . Bowel resection    . Chest tube insertion  03/13/2012    Procedure: CHEST TUBE INSERTION;  Surgeon: Kerin Perna, MD;  Location: [facility] OR;  Service: Thoracic;  Laterality: Right;    History  Smoking status  . Never Smoker   Smokeless tobacco  . Never Used    History  Alcohol Use  . Yes    Comment: seldom    Family History  Problem Relation Age of Onset  . Heart attack Mother   . Cancer Father     Review of Systems: The review of systems is per the HPI.  All other systems were reviewed and are negative.  Physical Exam: BP 140/90  Pulse 118  Ht 4\' 11"  (1.499 m)  Wt 137 lb 12.8 oz (62.506 kg)  BMI 27.82 kg/m2  SpO2 97% BP by me is 160/90. Oxygen sat of 97%. HR 110.  Patient is very pleasant and in no acute distress. Skin is warm and dry. Color is normal.  HEENT is unremarkable. Normocephalic/atraumatic. PERRL. Sclera are nonicteric. Neck is supple. No masses. No JVD. Lungs are fairly clear but with decreased breath sounds. Cardiac exam shows a regular rate and rhythm. Pacer in the right upper chest looks ok. Abdomen is soft. Extremities are without edema. Gait and ROM are intact. No gross neurologic deficits noted.  LABORATORY DATA:  Lab Results  Component Value Date   WBC 7.2 03/15/2012   HGB 10.9* 03/15/2012   HCT 30.8* 03/15/2012   PLT 214 03/15/2012   GLUCOSE 106* 03/15/2012   ALT 16 03/15/2012   AST 18 03/15/2012   NA 141 03/15/2012   K 3.9 03/15/2012   CL 104 03/15/2012   CREATININE 0.53 03/15/2012   BUN 11 03/15/2012   CO2 27 03/15/2012   TSH 2.313 03/11/2012   INR 1.01 03/13/2012   Echo Study Conclusions from January 2014  - Left ventricle: The cavity size was normal. Systolic function was normal. The estimated ejection fraction was in the range of 55% to 60%. Wall motion was normal; there were no regional wall motion abnormalities. - Right ventricle: The cavity size was mildly dilated. Wall thickness was normal.   Assessment / Plan: 1. Tachycardia/Dyspnea - will check labs including d dimer - send for CXR. Her pacemaker is checked - she is in sinus tach today.  Underlying rhythm is heart block. Start lopressor 12.5 mg bid - she is VERY hesitant to take any additional medicines.   2. Underlying PPM - with past bilateral pneumothoraces back in January of 2014  3. HTN - not controlled.  See her back in a week with Dr. Katrinka Blazing here. Check labs today. Start low dose beta blocker. She is on aspirin.   Patient is agreeable to this plan and will call if any problems develop in the interim.   [name], RN, ANP-C [facility] [address] [place], [place]  [address]

## 2012-12-26 NOTE — Patient Instructions (Signed)
We will check labs today  Please go to [facility] to [facility] on the first floor for a chest Xray - you may walk in.   Start Lopressor 25 mg to take just a half a tablet two times a day  Tylenol is safe  See me and Dr. Katrinka Blazing next week  Call the [facility] office at [phone] if you have any questions, problems or concerns.

## 2012-12-26 NOTE — Telephone Encounter (Signed)
S/w pt and daughter is aware of elevated d dimer on there way to [place] to get a ct angio with contrast stat s/w Selena Batten from radiology and Morrie Sheldon stated I didn't need pre-cert also trish cardmaster is aware.

## 2012-12-26 NOTE — Telephone Encounter (Signed)
I spoke with the patient. She has complaints of SOB, exhaustion, headache, and feeling shaky. She states that she had syncope outside of our office on 10/3. She was interrogated at that time and told her heart rates were fast. She states she feels today like she did prior to her PPM implant. She does feel like her heart is racing. She will come in today to see Norma Fredrickson for an appointment at 2:30 pm. She is agreeable. Gunnar Fusi in the device clinic has been made aware to interrogate her device.

## 2012-12-26 NOTE — Telephone Encounter (Signed)
New Problem:  Pt states she has a headache, she's shakey, SOB, and exhausted.. Pt also states she feel like her heart is raising... Pt states she feels like before she had her pacemaker. Please advise

## 2012-12-30 ENCOUNTER — Telehealth: Payer: Self-pay | Admitting: Interventional Cardiology

## 2012-12-30 NOTE — Telephone Encounter (Signed)
New message    Called to remind pt of her appt on Monday and she states that she does not feel good.  She feels "washed out".  Could it be the bp medication?

## 2012-12-30 NOTE — Telephone Encounter (Signed)
Patient states she just feels "washed out and overall not very good". States she is gradually feeling better over past couple of days "less shaky" than earlier this week. Denies worsening SOB, fatigue or dizziness. States she is seeing both Dr. Katrinka Blazing and Norma Fredrickson, NP, on Monday, 11/17, in appointment. Education provided on new medication, Metoprolol, including side effects (fatigue, depression, dizziness, hypotension) and what to look for and what to report to HCP. Patient also given recent lab results. Education provided on high blood glucose level/hyperglycemia.  Patient advised to discuss blood sugar results with Dr. Katrinka Blazing on Monday since she does not have a PCP. Also provided education on COPD.  Patient verbalized understanding and appreciation for information. She will call office back or seek emergent care should she feel worsening symptoms prior to her appointment on Monday, 01/02/13.

## 2013-01-02 ENCOUNTER — Encounter: Payer: Self-pay | Admitting: Nurse Practitioner

## 2013-01-02 ENCOUNTER — Ambulatory Visit (INDEPENDENT_AMBULATORY_CARE_PROVIDER_SITE_OTHER): Payer: Medicare Other | Admitting: Nurse Practitioner

## 2013-01-02 VITALS — BP 134/72 | HR 60 | Ht 59.0 in | Wt 138.4 lb

## 2013-01-02 DIAGNOSIS — I10 Essential (primary) hypertension: Secondary | ICD-10-CM | POA: Diagnosis not present

## 2013-01-02 DIAGNOSIS — R000 Tachycardia, unspecified: Secondary | ICD-10-CM

## 2013-01-02 NOTE — Patient Instructions (Addendum)
Stay on your current medicines  Try to monitor your blood pressure at home and keep a diary - this helps guide your care  See Dr. Katrinka Blazing back in 6 months (not December)  Call the [facility] office at [phone] if you have any questions, problems or concerns.

## 2013-01-02 NOTE — Progress Notes (Signed)
[name] Date of Birth: August 31, 1928 Medical Record #[id number]  History of Present Illness: Robin [name] is seen back today for a one week check. Seen for Dr. Katrinka Blazing. She has a history of high grade AV block - s/p DDD pacemaker - complicated by bilateral pneumothoraces. Other issues include narcolepsy and HTN.   Seen last week with feeling SOB and shakey. She was tachycardic. CXR ok. Pacemaker check showed sinus tach. D dimer slightly up - had CTA of the chest which was negative for PE. We did start low dose beta blocker.  Comes back today. Here with her daughter. Doing better. Notes that she felt a little weak and washed out when she first started the metoprolol. Had a spell where she felt "a hose had been put to her mough and she got flushed out". This has now resolved. Does not have a means of checking her BP at home. No palpitations. Some dyspnea still - CTA of the chest showed COPD - no PE.   Current Outpatient Prescriptions  Medication Sig Dispense Refill  . acetaminophen (TYLENOL) 325 MG tablet Take 325 mg by mouth every 6 (six) hours as needed.       Marland Kitchen aspirin EC 81 MG EC tablet Take 1 tablet (81 mg total) by mouth daily.      . metoprolol tartrate (LOPRESSOR) 25 MG tablet Take 0.5 tablets (12.5 mg total) by mouth 2 (two) times daily.  90 tablet  3   No current facility-administered medications for this visit.    Allergies  Allergen Reactions  . Influenza A (H1n1) Monoval Pf   . Penicillins Hives and Rash    Past Medical History  Diagnosis Date  . LBBB (left bundle branch block)-rate related   . Narcolepsy   . Vulva cancer   . Mobitz type 2 second degree AV block     2:1  with RBBB    Past Surgical History  Procedure Laterality Date  . Wrist fracture surgery    . Hernia repair    . Mastoidectomy revision    . Tonsillectomy    . Abdominal surgery      exploratory  . Abdominal hysterectomy    . Bowel resection    . Chest tube insertion  03/13/2012    Procedure: CHEST TUBE INSERTION;  Surgeon: Kerin Perna, MD;  Location: [facility] OR;  Service: Thoracic;  Laterality: Right;    History  Smoking status  . Never Smoker   Smokeless tobacco  . Never Used    History  Alcohol Use  . Yes    Comment: seldom    Family History  Problem Relation Age of Onset  . Heart attack Mother   . Cancer Father     Review of Systems: The review of systems is per the HPI.  All other systems were reviewed and are negative.  Physical Exam: BP 134/72  Pulse 60  Ht 4\' 11"  (1.499 m)  Wt 138 lb 6.4 oz (62.778 kg)  BMI 27.94 kg/m2 Patient is very pleasant and in no acute distress. Skin is warm and dry. Color is normal.  HEENT is unremarkable. Normocephalic/atraumatic. PERRL. Sclera are nonicteric. Neck is supple. No masses. No JVD. Lungs are clear. Cardiac exam shows a regular rate and rhythm. Rate much better today. Abdomen is soft. Extremities are without edema. Gait and ROM are intact. No gross neurologic deficits noted.  LABORATORY DATA: Lab Results  Component Value Date   WBC 7.4 12/26/2012   HGB 13.5 12/26/2012  HCT 40.3 12/26/2012   PLT 282.0 12/26/2012   GLUCOSE 136* 12/26/2012   ALT 16 03/15/2012   AST 18 03/15/2012   NA 141 12/26/2012   K 4.2 12/26/2012   CL 104 12/26/2012   CREATININE 0.8 12/26/2012   BUN 10 12/26/2012   CO2 28 12/26/2012   TSH 1.58 12/26/2012   INR 1.01 03/13/2012    Dg Chest 2 View  12/26/2012     IMPRESSION: No active cardiopulmonary disease.   Electronically Signed   By: Robin [name] M.D.   On: 12/26/2012 16:14   Ct Angio Chest W/cm &/or Wo Cm  12/26/2012    IMPRESSION: No evidence of pulmonary embolus.  COPD. Areas of dependent atelectasis basilar scarring.   Electronically Signed   By: Robin [name] M.D.   On: 12/26/2012 19:58    Assessment / Plan: 1. Tachycardia - resolved with low dose beta blocker  2. HTN - BP improved with beta blocker  3. Dyspnea - most likely from her COPD  4. Underlying PPM  - followed by EP.  She is quite hesitant to be on any medicine - she is agreeable to staying on her current regimen. I have encouraged her to check her BP at home. See Dr. Katrinka Blazing back in 6 months and not next month.   Patient is agreeable to this plan and will call if any problems develop in the interim.   Robin Macadamia, RN, ANP-C [facility] [address] [place], [place]  [address]

## 2013-02-01 ENCOUNTER — Ambulatory Visit: Payer: Medicare Other | Admitting: Interventional Cardiology

## 2013-02-28 ENCOUNTER — Encounter: Payer: Self-pay | Admitting: *Deleted

## 2013-03-17 ENCOUNTER — Telehealth: Payer: Self-pay | Admitting: Nurse Practitioner

## 2013-03-17 NOTE — Telephone Encounter (Signed)
New message  Patient would like to stop taking Metporol, please call and advise.

## 2013-03-17 NOTE — Telephone Encounter (Signed)
Sending to Lori,please advise

## 2013-03-17 NOTE — Telephone Encounter (Signed)
Her metoprolol was discussed with Dr. Tamala Julian - this was to help her fast heart beat and her elevated BP. She is on a very low dose - she may cut back to just one a day for 3 days and then stop. I would suggest that she follow back up with Dr. Tamala Julian.

## 2013-03-17 NOTE — Telephone Encounter (Signed)
Do we know why she would like to stop?

## 2013-03-17 NOTE — Telephone Encounter (Signed)
S/w pt has not felt right since a fall in October stated only does device check one time a year through telephone and when pt fell stated came into our office  To get checked out and Nevin Bloodgood from device clinic stated we got to get this thing down, felt pt's settings were changed on pacemaker and has not felt good since than, also stated  Received a letter from device clinic not even signed was [facility] January 13th and received letter January 26 wasn't sure about the letter.  Pt wants to know how often is pt suppose to get device checked up till now it has been once a year.  Stated Truitt Merle, NP put pt on metoprolol and stated hadn't felt good since that medication has had all the side effects listed on medication.  Wants to stop but pt states has to wean off of medication.  Kids but pt a bvp cuff for christmas told them to take it back was not interested in taking bp. Stated when pt saw Dr. Tamala Julian last said rather not put pt on any medication pt's body does not do well on medication. Pt thought Truitt Merle, NP discussed metoprolol with Dr. Tamala Julian.  Pt just feels lousy. I offered pt an appointment does not want to come in stated last time pt came in rushed to hospital to get a CT scan had to pay 100.00 dollars does not have that kind of money and does not want to have to come back in the office and spend more money.  I stated will send this to Truitt Merle, NP and to Emeline Darling, office manager. I will call back on Monday and I stated will be expecting a phone call from our manager. I stated I was a little confused is the medication making pt not feel good or changing the settings on the device pt stated did not know.

## 2013-03-20 NOTE — Telephone Encounter (Signed)
S/w pt about metoprolol stated it takes months to wean off of medication, Cecille Rubin stated on such low dosage it would only take three days. Pt asked about what I found out about device I went down and talked to [place] in device and will be call ing the patient back regarding this matter

## 2013-04-01 IMAGING — CR DG CHEST 2V
2 series · 2 of 2 positions shown · non-contrast
Comparison: 04/06/2012.

CLINICAL DATA: Right chest pain.

CHEST - 2 VIEW

[view not recorded (1 of 2)]
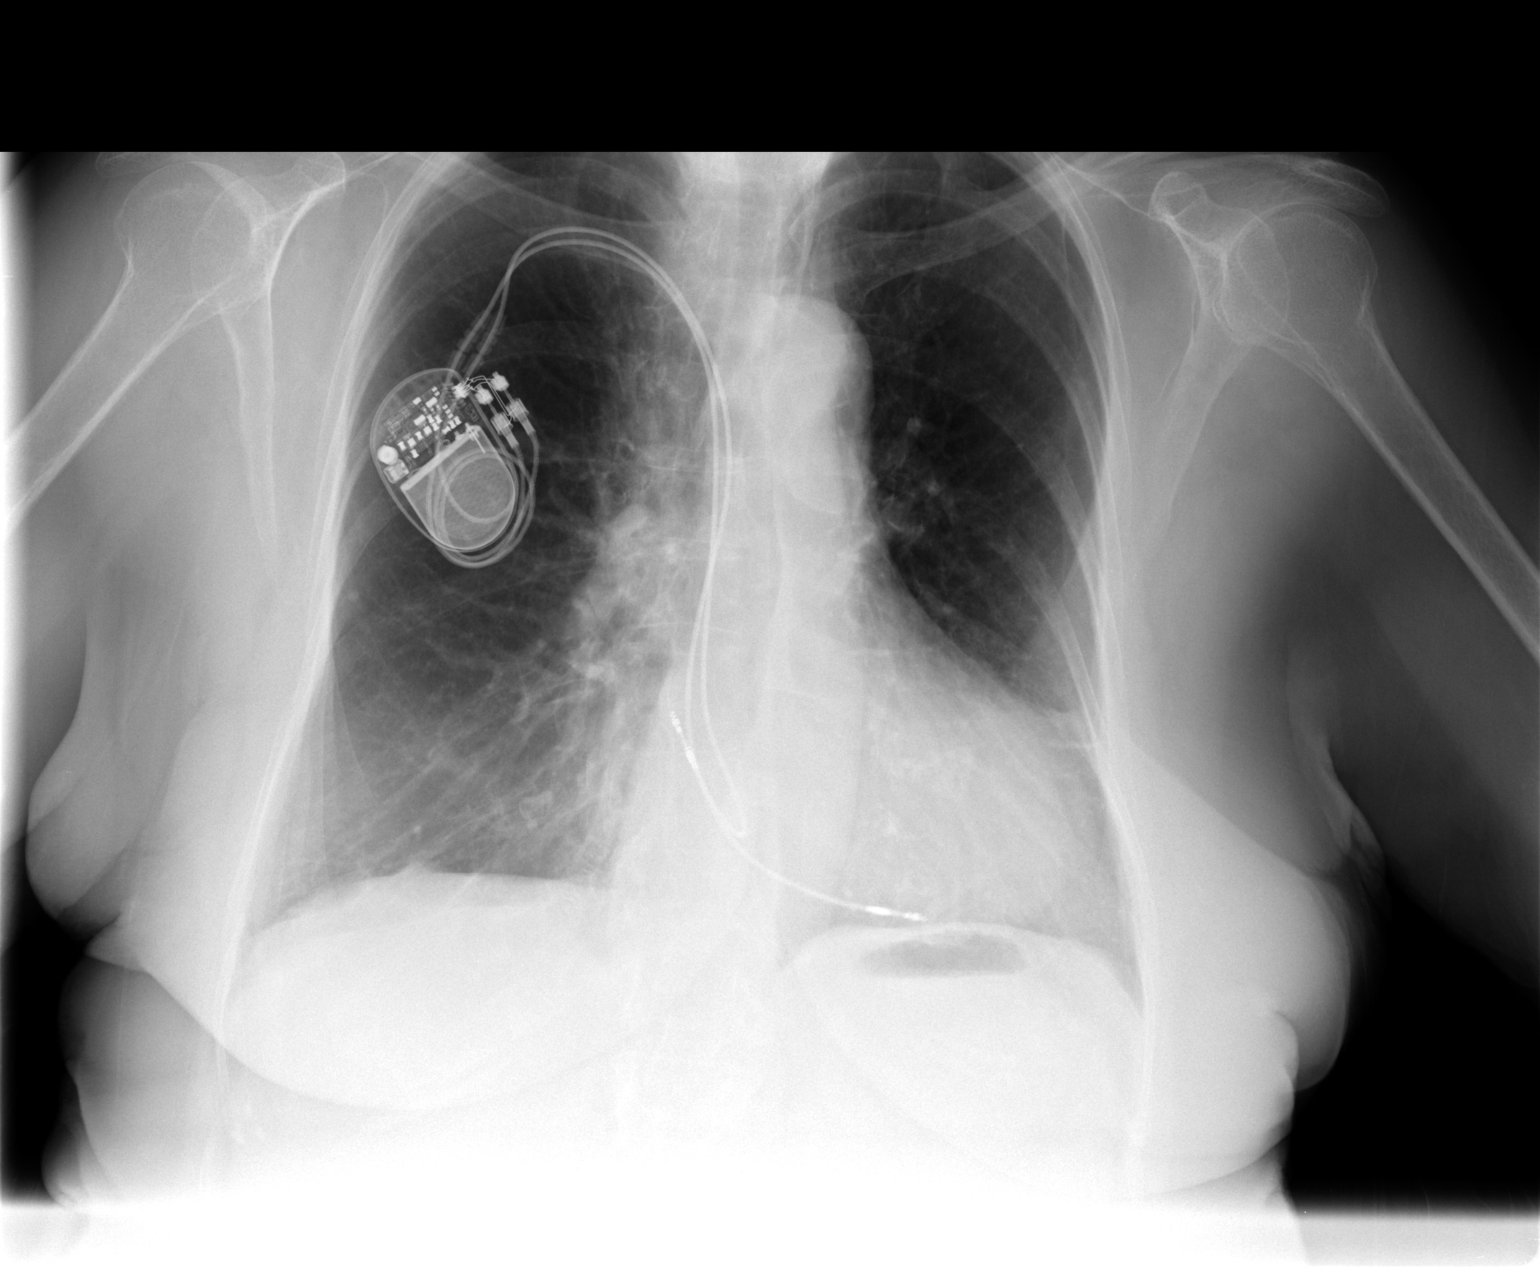

[view not recorded (2 of 2)]
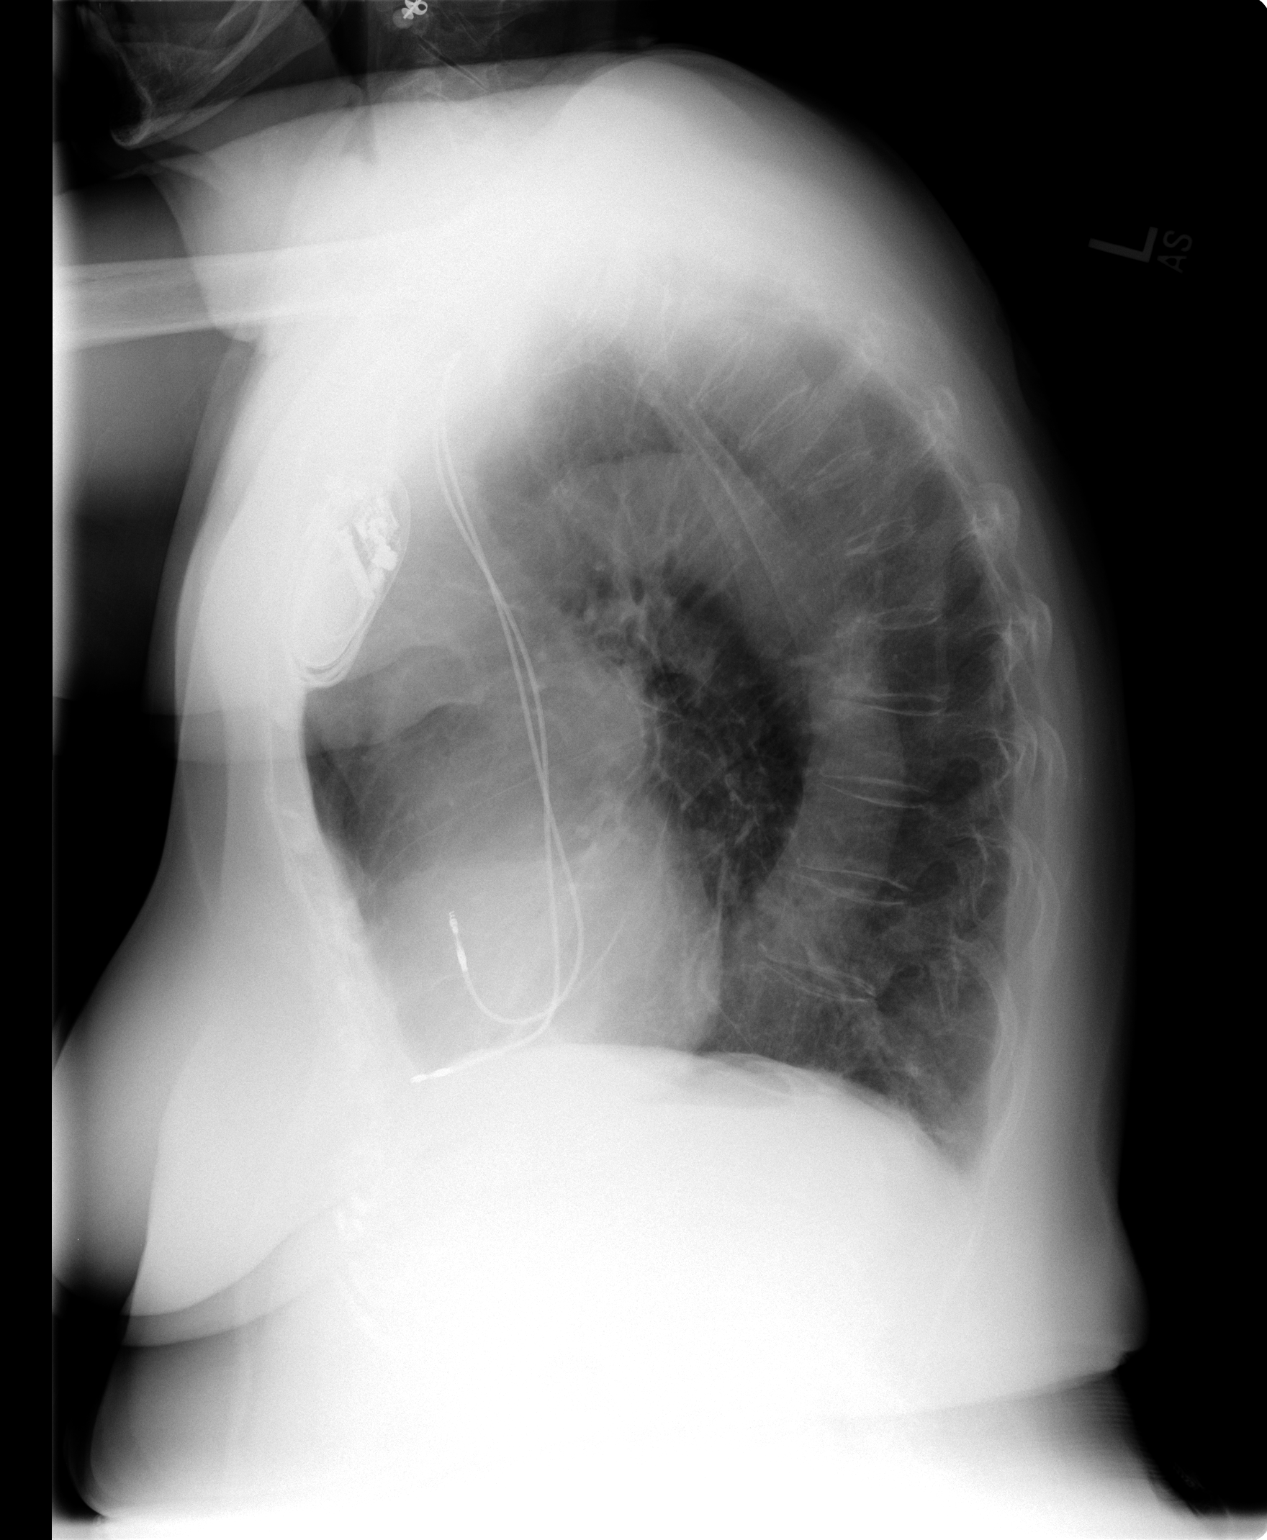

[2 of 2 positions shown; findings below may reference images not displayed]

FINDINGS: Trachea is midline.  Heart size stable.  Scarring in the
lingula.  Right subclavian pacemaker lead tips project over the
right atrium and right ventricle.  Lungs are hyperinflated with
right basilar subsegmental atelectasis or scarring.  No pleural
fluid.
IMPRESSION: No acute findings.

## 2013-04-06 ENCOUNTER — Encounter: Payer: Medicare Other | Admitting: Internal Medicine

## 2013-09-11 ENCOUNTER — Encounter: Payer: Self-pay | Admitting: Internal Medicine

## 2013-09-18 ENCOUNTER — Telehealth: Payer: Self-pay | Admitting: Internal Medicine

## 2013-09-18 ENCOUNTER — Encounter: Payer: Self-pay | Admitting: Internal Medicine

## 2013-09-18 NOTE — Telephone Encounter (Signed)
09-11-13 SENT PAST DUE LETTER, NEEDS PACER CK WITH TAYLOR/MT

## 2013-09-21 DIAGNOSIS — C519 Malignant neoplasm of vulva, unspecified: Secondary | ICD-10-CM | POA: Diagnosis not present

## 2013-11-30 IMAGING — CR DG CHEST 2V
2 series · 2 of 2 positions shown · non-contrast
Comparison: 04/27/2012

CLINICAL DATA: Short of breath and tachycardia

EXAM:
CHEST  2 VIEW

[w chest pa]
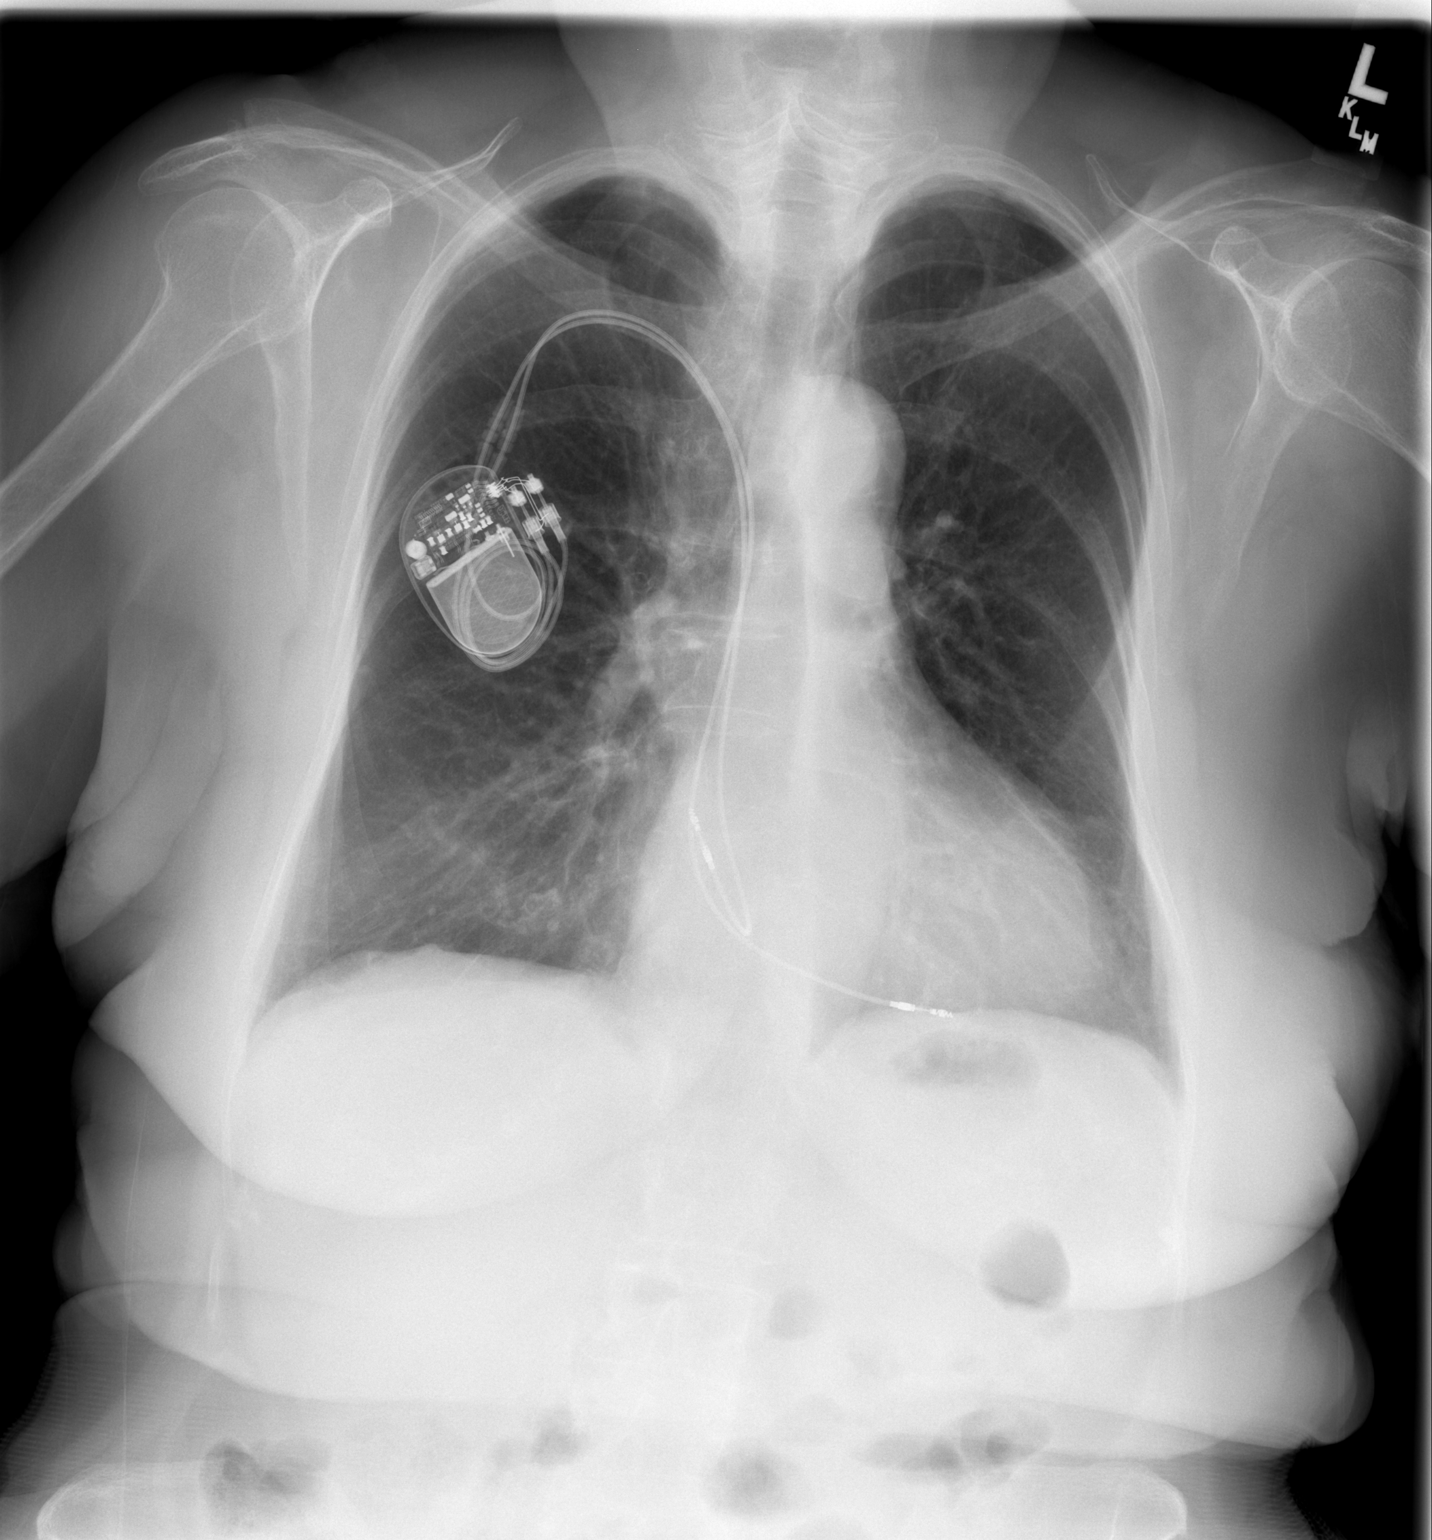

[w chest lat]
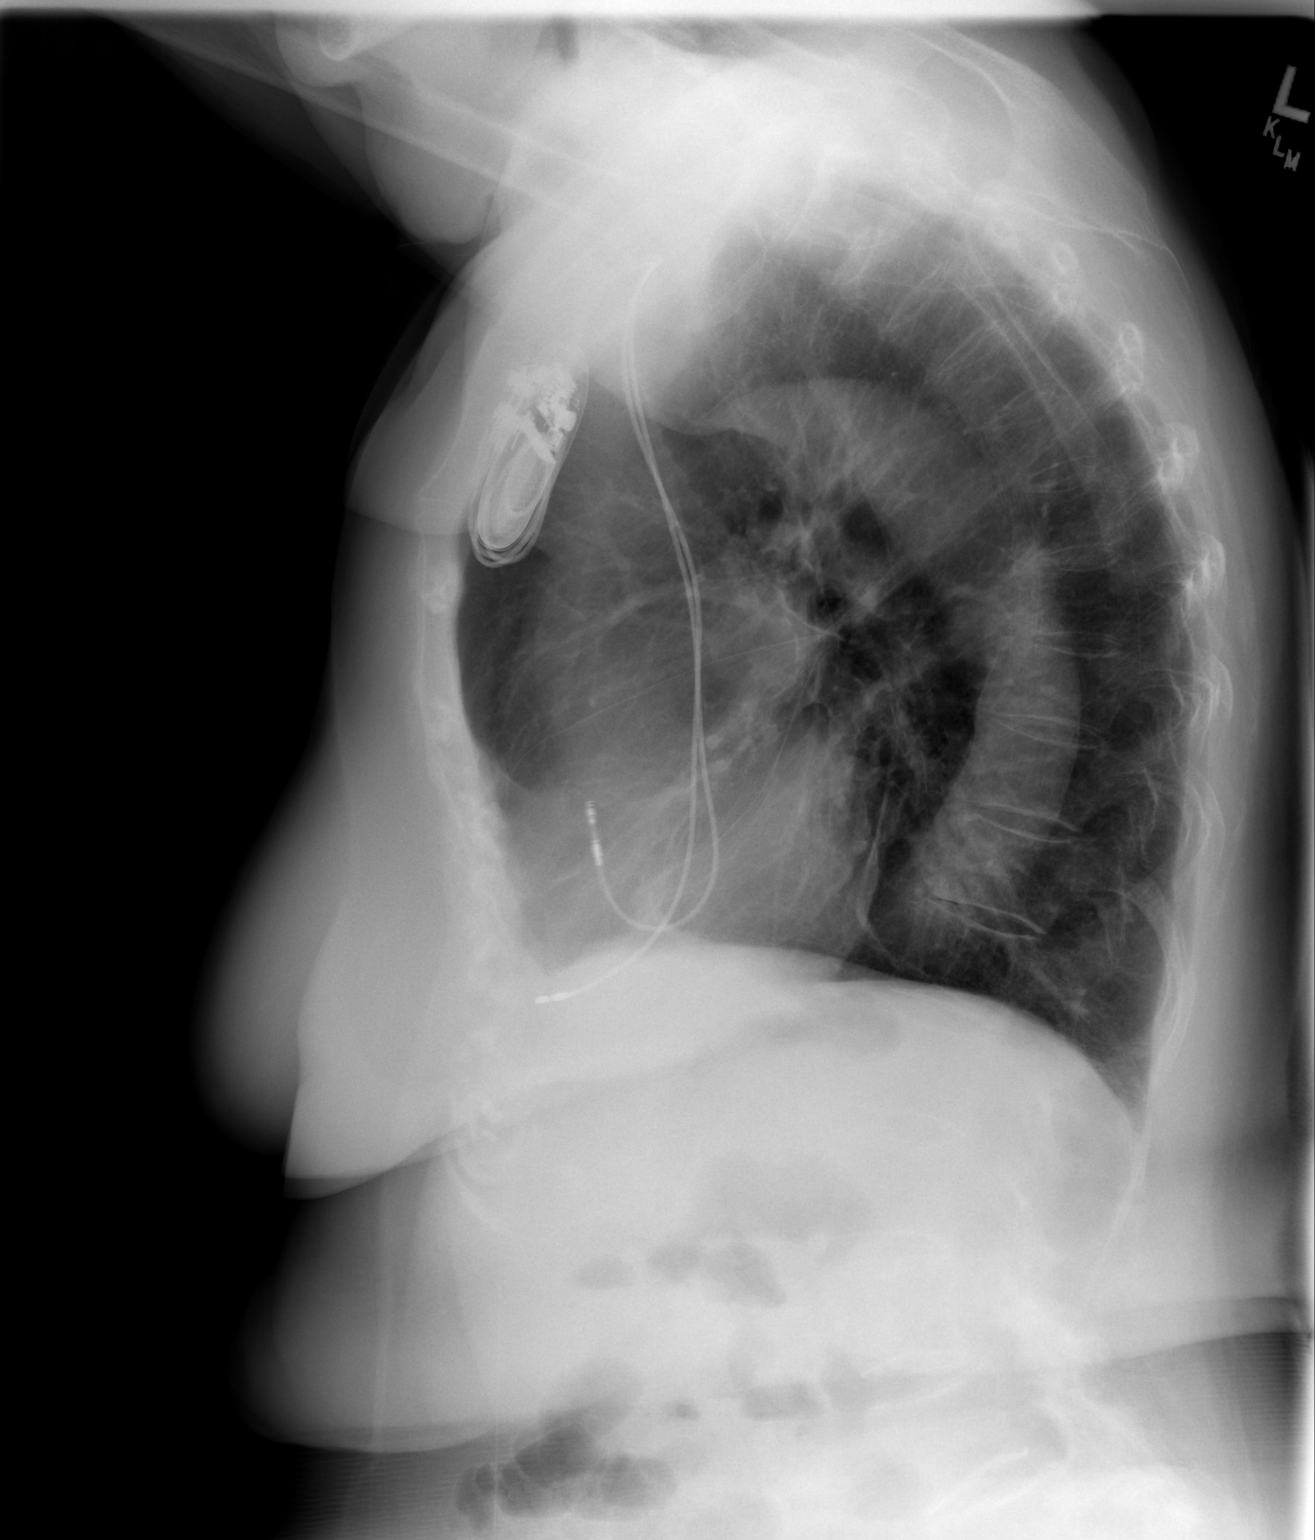

[2 of 2 positions shown; findings below may reference images not displayed]

FINDINGS: Dual lead pacemaker is unchanged. Negative for heart failure.
Negative for infiltrate effusion or mass.
IMPRESSION: No active cardiopulmonary disease.

## 2013-11-30 IMAGING — CT CT ANGIO CHEST
2 of 8 series · 19 of 46 positions shown · IV contrast (APPLIED)
Comparison: Chest x-ray performed earlier today.

CLINICAL DATA: Elevated D-dimer. Shortness of breath.

EXAM:
CT ANGIOGRAPHY CHEST WITH CONTRAST
TECHNIQUE: Multidetector CT imaging of the chest was performed using the
standard protocol during bolus administration of intravenous
contrast. Multiplanar CT image reconstructions including MIPs were
obtained to evaluate the vascular anatomy.
CONTRAST:  40mL OMNIPAQUE IOHEXOL 350 MG/ML SOLN

[Series 5: thins · axial · 0.59mm/px · z∈[+1365,+1570]mm · 16 of 227 slices shown]
[im 11/227  lung]
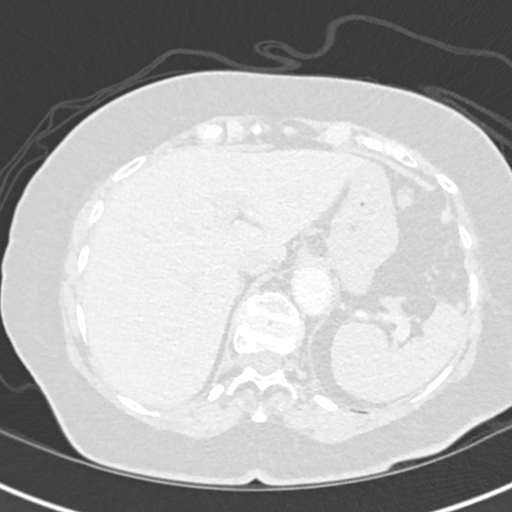
[im 21/227  soft-tissue]
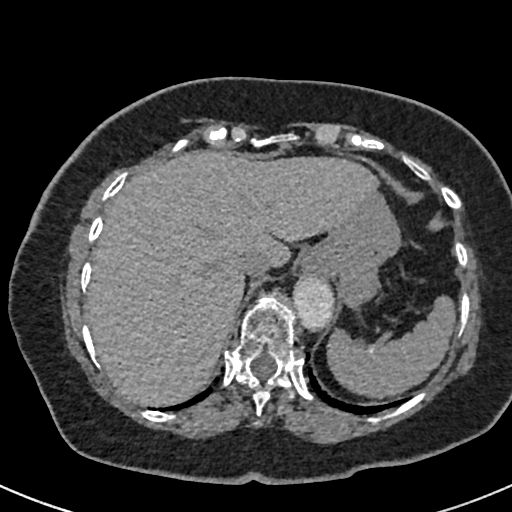
[im 42/227  lung]
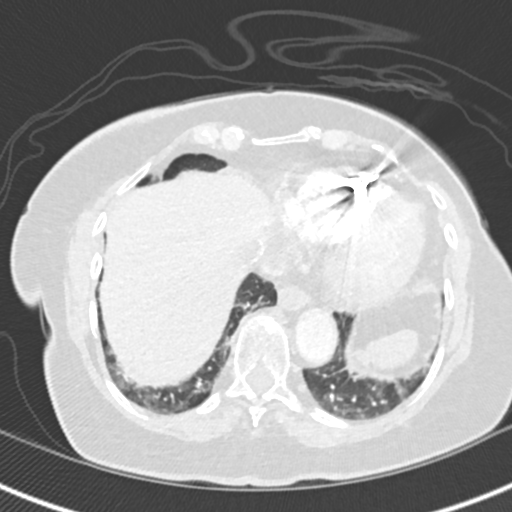
[im 52/227  soft-tissue]
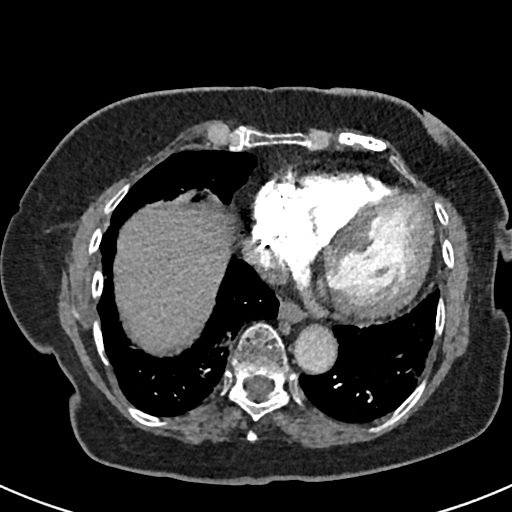
[im 62/227  lung]
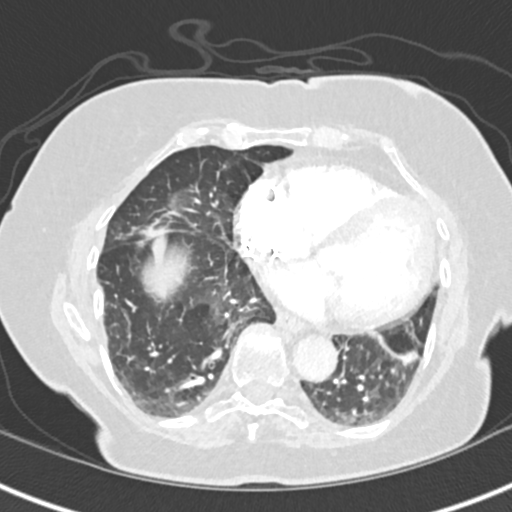
[im 83/227  soft-tissue]
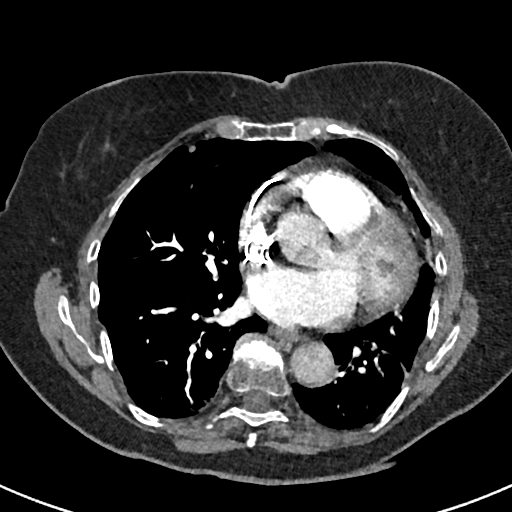
[im 93/227  lung]
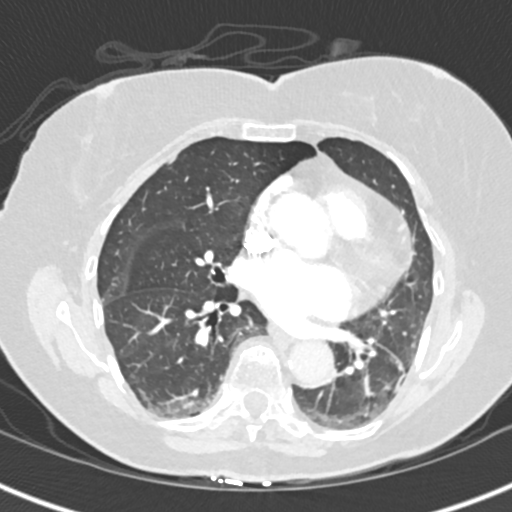
[im 103/227  soft-tissue]
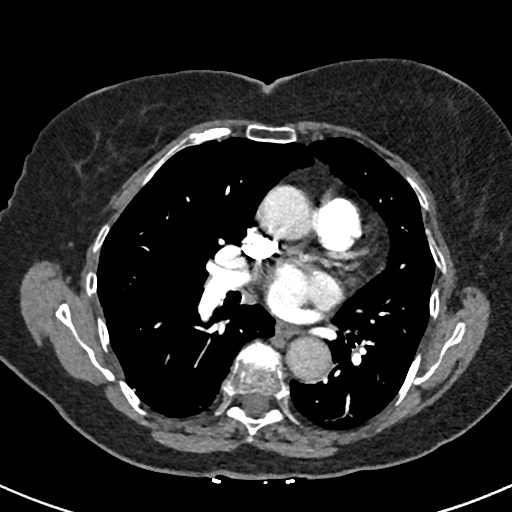
[im 124/227  lung]
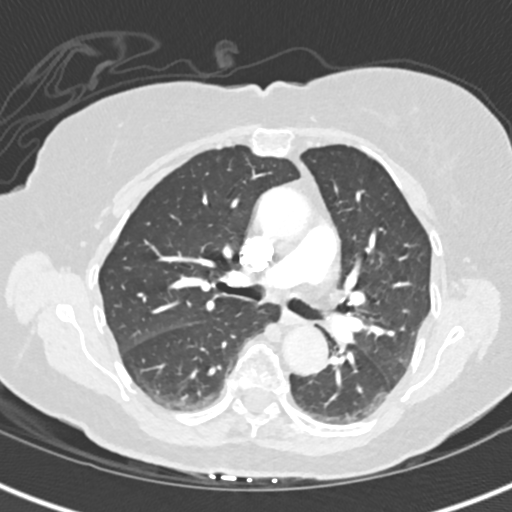
[im 134/227  soft-tissue]
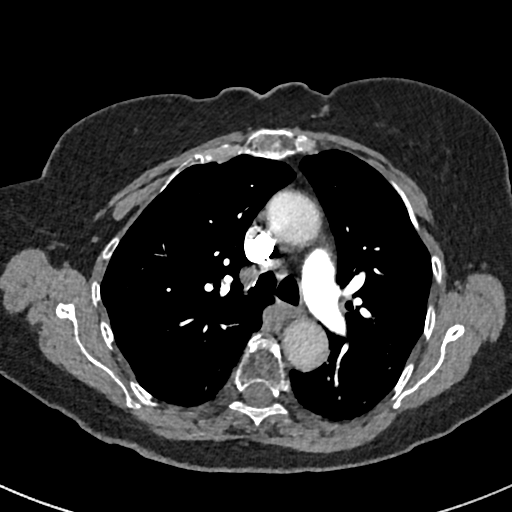
[im 144/227  lung]
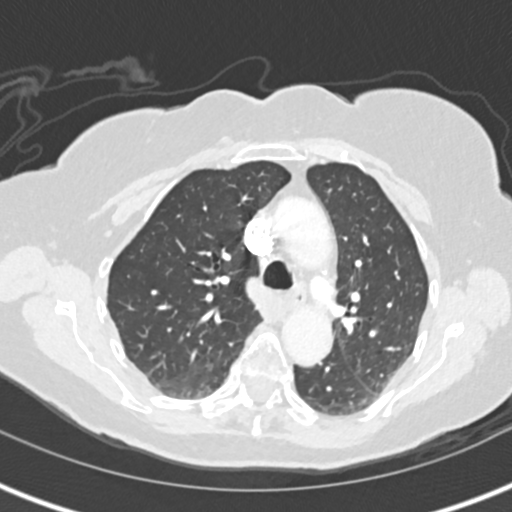
[im 165/227  soft-tissue]
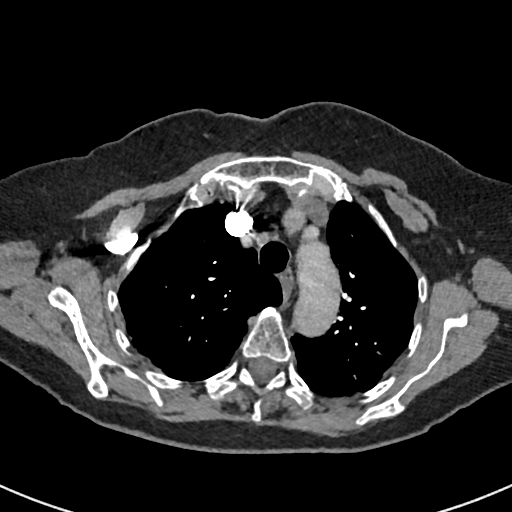
[im 175/227  lung]
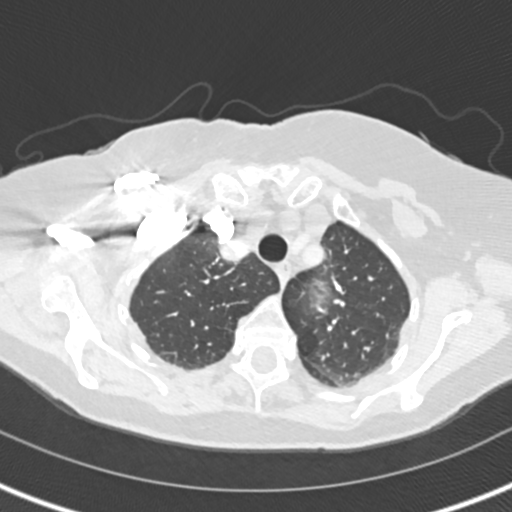
[im 185/227  soft-tissue]
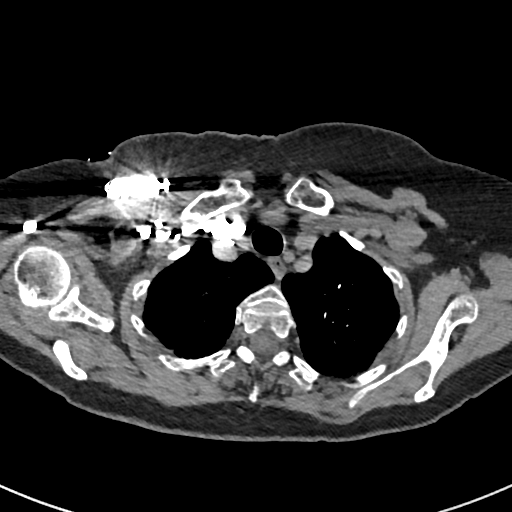
[im 206/227  lung]
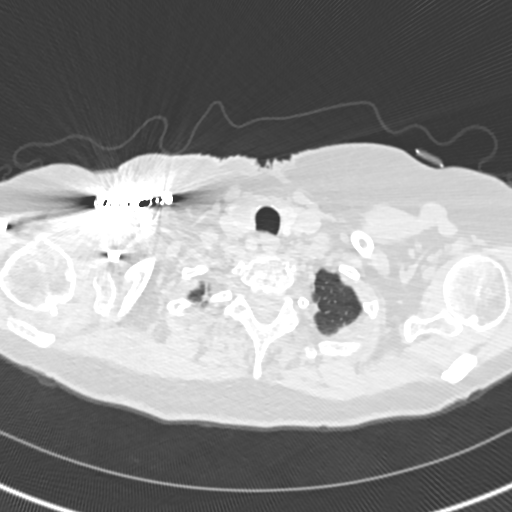
[im 216/227  soft-tissue]
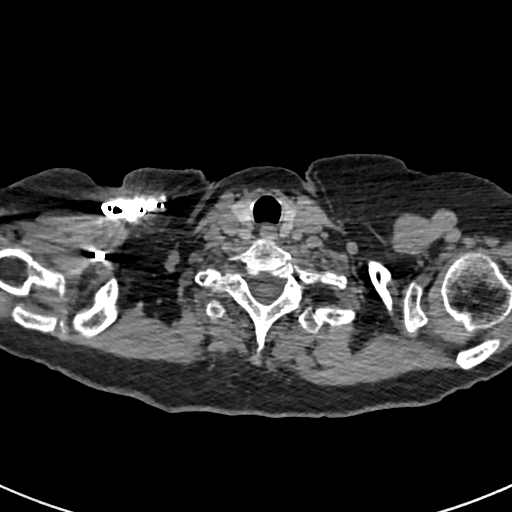

[Series 7: coronal mpr · coronal · 0.59mm/px · 3 of 110 slices shown]
[im 28/110  soft-tissue]
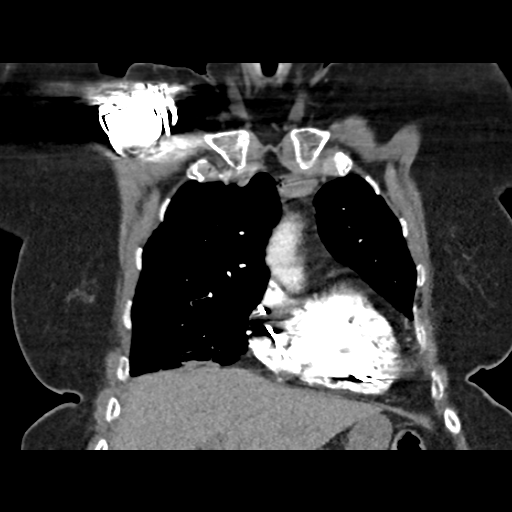
[im 55/110  soft-tissue]
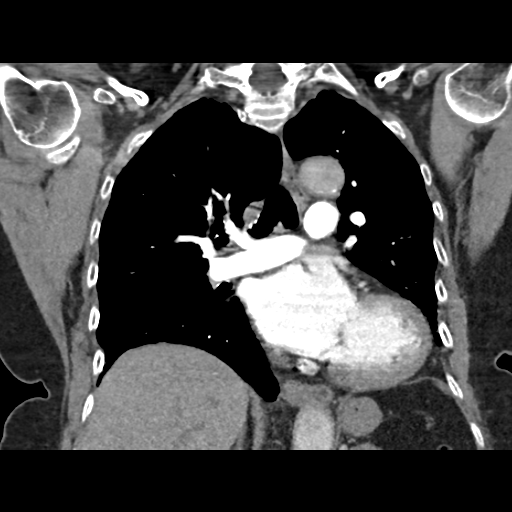
[im 82/110  soft-tissue]
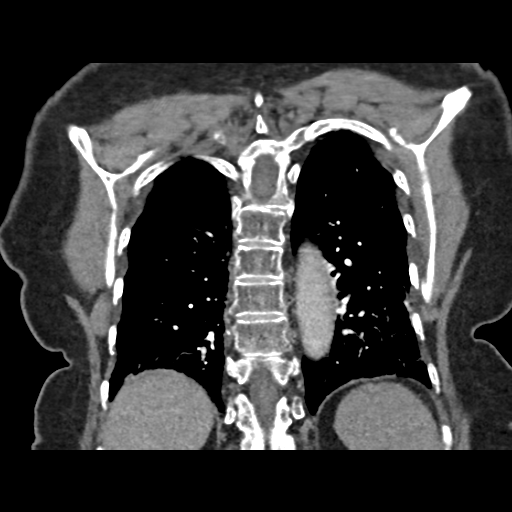

[19 of 46 positions shown; findings below may reference images not displayed]

FINDINGS: No filling defects in the pulmonary arteries to suggest pulmonary
emboli. Emphysematous changes in the lungs. Dependent linear and
ground-glass opacities, likely atelectasis. Scarring in the lung
bases. No suspicious pulmonary nodules. No pleural effusions. Mild
tortuosity of the thoracic aorta without aneurysm. Heart is upper
limits normal size. No mediastinal, hilar, or axillary adenopathy.
Chest wall soft tissues are unremarkable. Imaging into the upper
abdomen shows no acute findings.

No acute bony abnormality. Degenerative changes in the mid thoracic
spine.

Review of the MIP images confirms the above findings.
IMPRESSION: No evidence of pulmonary embolus.

COPD. Areas of dependent atelectasis basilar scarring.

## 2014-01-25 ENCOUNTER — Encounter (HOSPITAL_COMMUNITY): Payer: Self-pay | Admitting: Internal Medicine

## 2014-02-15 DIAGNOSIS — S60211A Contusion of right wrist, initial encounter: Secondary | ICD-10-CM | POA: Diagnosis not present

## 2014-08-03 ENCOUNTER — Encounter: Payer: Self-pay | Admitting: *Deleted

## 2014-09-27 DIAGNOSIS — C519 Malignant neoplasm of vulva, unspecified: Secondary | ICD-10-CM | POA: Diagnosis not present

## 2015-01-28 NOTE — Progress Notes (Signed)
Cardiology Office Note Date:  01/29/2015  Patient ID:  Robin [name], DOB 07-Apr-1928, MRN [id number] PCP:  No PCP Per Patient  Cardiologist:  Dr. Tamala Julian Electrophysiologist: Dr. Caryl Comes   Chief Complaint: the patient received a letter that she needed to make an appointment, she was overdue for visit.  It appears her last in-office cardiology visit was Nov 2014  History of Present Illness: Robin [name] is a 81 y.o. female with history of high AV block and PPM, known LBBB, HTN, and narcolepsy.  She comes today accompanied by her daughter.  She reports a chronic feeling of chest aching and feeling of breathlessness since her pacer implant (that was complicated by b/l ptx and ), not changing or escalating, certain twisting movements like putting on a seat belt will give her pain where she had the chest tube on the right.  She reports generalized aches/pains "of an 81 year old" that she feels is expected for someone her age.  She reports 4 falls over the last couple years that she had wrist trauma trying to brace her falls, denies theses an blackouts, or dizzy spells, feels it is a balance issue.  She has not had near syncope or syncope.  She reports infrequent fast heart rates, states she will slow what she is doing and will settle back down, she does not describe a feeling of an irregular beat, just fast, not for any prolonged episodes.  She has the remote box at home plugged in by her bedside, but doesn't recall anyone setting her up to do them.  Past Medical History  Diagnosis Date  . LBBB (left bundle branch block)-rate related   . Narcolepsy   . Vulva cancer ([place])   . Mobitz type 2 second degree AV block     2:1  with RBBB    Past Surgical History  Procedure Laterality Date  . Wrist fracture surgery    . Hernia repair    . Mastoidectomy revision    . Tonsillectomy    . Abdominal surgery      exploratory  . Abdominal hysterectomy    . Bowel resection    .  Chest tube insertion  03/13/2012    Procedure: CHEST TUBE INSERTION;  Surgeon: Ivin Poot, MD;  Location: [facility];  Service: Thoracic;  Laterality: Right;  . Permanent pacemaker insertion N/A 03/11/2012    Procedure: PERMANENT PACEMAKER INSERTION;  Surgeon: Deboraha Sprang, MD;  Location: [facility] CATH LAB;  Service: Cardiovascular;  Laterality: N/A;    Current Outpatient Prescriptions  Medication Sig Dispense Refill  . aspirin EC 81 MG EC tablet Take 1 tablet (81 mg total) by mouth daily.     No current facility-administered medications for this visit.    Allergies:   Influenza a (h1n1) monoval pf; Other; and Penicillins   Social History:  The patient  reports that she has never smoked. She has never used smokeless tobacco. She reports that she drinks alcohol. She reports that she does not use illicit drugs. She did have second hand smoke exposure  Family History:  The patient's family history includes Cancer in her father; Heart attack in her mother.  ROS:  Please see the history of present illness.    All other systems are reviewed and otherwise negative.   PHYSICAL EXAM:  156/90, a quick recheck was 148/88 VS:  BP 148/88 mmHg  Pulse 99  Ht 4\' 11"  (1.499 m)  Wt 143 lb (64.864 kg)  BMI 28.87 kg/m2  BMI: Body mass index is 28.87 kg/(m^2). Well nourished elderly WF in no acute distress HEENT: normocephalic, atraumatic Neck: no JVD, carotid bruits or masses Cardiac:  normal S1, S2; RRR; no significant murmurs, no rubs, or gallops Lungs:  clear to auscultation bilaterally, no wheezing, rhonchi or rales Abd: soft, nontender MS: no deformity or atrophy Ext: no edema Skin: warm and dry, no rash Neuro:  No gross deficits appreciated Psych: euthymic mood, full affect  PPM site is stable, no tethering or discomfort   EKG:  Done today shows SR, V,paced PPM check today shows normal function, she has had brief AMS episodes appear to be brief A tach episodes, <1%since imlant  03/11/12:  Echocardiogram Study Conclusions - Left ventricle: The cavity size was normal. Systolic function was normal. The estimated ejection fraction was in the range of 55% to 60%. Wall motion was normal; there were no regional wall motion abnormalities. - Right ventricle: The cavity size was mildly dilated. Wall thickness was normal.  Recent Labs: No results found for requested labs within last 365 days.  No results found for requested labs within last 365 days.   CrCl cannot be calculated (Patient has no serum creatinine result on file.).   Wt Readings from Last 3 Encounters:  01/29/15 143 lb (64.864 kg)  01/02/13 138 lb 6.4 oz (62.778 kg)  12/26/12 137 lb 12.8 oz (62.506 kg)     Other studies reviewed: Additional studies/records reviewed today include: summarized above  DEVICE:  STJ Accent PPM implanted 01/27/13, Dr. Caryl Comes  ASSESSMENT AND PLAN: 1. High degree AVblock, PPM              We will get her scheduled to start Q 3 month [place] remotes  2. HTN, she reports last week when she checked it was 156/?             She is not aggreeable to consider BP medicines, historically was given a medication for her BP that made her feel terrible.              Mentioned there were many options for BP control, but she reports she tends to be sensitive to medicines and tends to have reactions and does not want to start BP medication    3. CP/SOB       Is described as chronic, unchanged over the last 2 years since her PPM implant that was unfortunately complicated with b/l ptx   4. Brief A tach episodes appear infrequent and brief on her device check, records indicate history of being on metoprolol for ST     We will continue to monitor via her remotes        Discussed/recommend:  1. That she get a PMD to help care for her general medicine needs as they arise, though she does not  feel this is needed 2. Recommend consideration for further BP evaluation and management, she prefers to  take no medications, though is seeing Dr. Tamala Julian today, will defer further discussion to her visit with him. 3. Q 3 month remote PPM transmissions, and a 1 year annual EP visit, in-clinic check, MD visit      If for any reason we are unable to get her remote transmission, I told her we will give her a call. 5. Regarding her falls, recommend she see a PMD for further evaluation into this, though she feels this is a natural aging process, not associated with her narcolepsy and no further evaluation is needed.  Disposition:  F/u with Dr. Tamala Julian today  The patient/daughter after our visit completed mentioned to [place] that she did not want to f/u particularly with Dr. Caryl Comes, when the [place] brought this to my attention I offered for them to come back inside to discuss further to offer an alternative EP MD, she declined to come back in to the exam room though and said she would ask Dr. Thompson Caul opinion and go from there.  Haywood Lasso, PA-C 01/29/2015 3:59 PM     [place] [address] [phone] (office)  [phone] (fax)

## 2015-01-29 ENCOUNTER — Ambulatory Visit (INDEPENDENT_AMBULATORY_CARE_PROVIDER_SITE_OTHER): Payer: Medicare Other | Admitting: Physician Assistant

## 2015-01-29 ENCOUNTER — Ambulatory Visit (INDEPENDENT_AMBULATORY_CARE_PROVIDER_SITE_OTHER): Payer: Medicare Other | Admitting: Interventional Cardiology

## 2015-01-29 ENCOUNTER — Encounter: Payer: Self-pay | Admitting: Interventional Cardiology

## 2015-01-29 ENCOUNTER — Encounter: Payer: Self-pay | Admitting: Physician Assistant

## 2015-01-29 ENCOUNTER — Encounter: Payer: Self-pay | Admitting: Internal Medicine

## 2015-01-29 VITALS — BP 148/88 | HR 99 | Ht 59.0 in | Wt 143.0 lb

## 2015-01-29 VITALS — BP 140/92 | HR 98 | Ht 59.0 in | Wt 141.0 lb

## 2015-01-29 DIAGNOSIS — R030 Elevated blood-pressure reading, without diagnosis of hypertension: Secondary | ICD-10-CM

## 2015-01-29 DIAGNOSIS — I5033 Acute on chronic diastolic (congestive) heart failure: Secondary | ICD-10-CM | POA: Diagnosis not present

## 2015-01-29 DIAGNOSIS — IMO0001 Reserved for inherently not codable concepts without codable children: Secondary | ICD-10-CM

## 2015-01-29 DIAGNOSIS — I441 Atrioventricular block, second degree: Secondary | ICD-10-CM | POA: Diagnosis not present

## 2015-01-29 DIAGNOSIS — Z950 Presence of cardiac pacemaker: Secondary | ICD-10-CM

## 2015-01-29 DIAGNOSIS — I10 Essential (primary) hypertension: Secondary | ICD-10-CM | POA: Diagnosis not present

## 2015-01-29 LAB — CUP PACEART INCLINIC DEVICE CHECK
Battery Remaining Longevity: 111.6
Brady Statistic RA Percent Paced: 2.1 %
Brady Statistic RV Percent Paced: 96 %
Date Time Interrogation Session: 20161213151852
Implantable Lead Location: 753859
Lead Channel Pacing Threshold Amplitude: 0.5 V
Lead Channel Pacing Threshold Amplitude: 0.75 V
Lead Channel Pacing Threshold Pulse Width: 0.4 ms
Lead Channel Sensing Intrinsic Amplitude: 4.2 mV
Lead Channel Sensing Intrinsic Amplitude: 6.7 mV
Lead Channel Setting Pacing Amplitude: 0.75 V
Lead Channel Setting Sensing Sensitivity: 2 mV
MDC IDC LEAD IMPLANT DT: 20140124
MDC IDC LEAD IMPLANT DT: 20140124
MDC IDC LEAD LOCATION: 753860
MDC IDC MSMT BATTERY VOLTAGE: 2.93 V
MDC IDC MSMT LEADCHNL RA IMPEDANCE VALUE: 337.5 Ohm
MDC IDC MSMT LEADCHNL RV IMPEDANCE VALUE: 662.5 Ohm
MDC IDC MSMT LEADCHNL RV PACING THRESHOLD PULSEWIDTH: 0.4 ms
MDC IDC SET LEADCHNL RA PACING AMPLITUDE: 1.75 V
MDC IDC SET LEADCHNL RV PACING PULSEWIDTH: 0.4 ms
Pulse Gen Model: 2110
Pulse Gen Serial Number: 7431184

## 2015-01-29 NOTE — Progress Notes (Signed)
Cardiology Office Note   Date:  01/29/2015   ID:  Robin [name], DOB Jul 13, 1928, MRN [id number]  PCP:  No PCP Per Patient  Cardiologist:  Sinclair Grooms, MD   Chief Complaint  Patient presents with  . Pacemaker Problem      History of Present Illness: Robin [name] is a 81 y.o. female who presents for intrinsic conduction system disease, permanent pacemaker, and  chronic diastolic heart failure.   Atypical chest pain with no dyspnea or palpitations. There is no edema. Has not fainted or had palpitations. She is on specific cardiac medications. First follow-up in quite some time.  Past Medical History  Diagnosis Date  . LBBB (left bundle branch block)-rate related   . Narcolepsy   . Vulva cancer ([place])   . Mobitz type 2 second degree AV block     2:1  with RBBB    Past Surgical History  Procedure Laterality Date  . Wrist fracture surgery    . Hernia repair    . Mastoidectomy revision    . Tonsillectomy    . Abdominal surgery      exploratory  . Abdominal hysterectomy    . Bowel resection    . Chest tube insertion  03/13/2012    Procedure: CHEST TUBE INSERTION;  Surgeon: Ivin Poot, MD;  Location: [facility];  Service: Thoracic;  Laterality: Right;  . Permanent pacemaker insertion N/A 03/11/2012    Procedure: PERMANENT PACEMAKER INSERTION;  Surgeon: Deboraha Sprang, MD;  Location: [facility] CATH LAB;  Service: Cardiovascular;  Laterality: N/A;     Current Outpatient Prescriptions  Medication Sig Dispense Refill  . aspirin EC 81 MG EC tablet Take 1 tablet (81 mg total) by mouth daily.     No current facility-administered medications for this visit.    Allergies:   Influenza a (h1n1) monoval pf; Other; and Penicillins    Social History:  The patient  reports that she has never smoked. She has never used smokeless tobacco. She reports that she drinks alcohol. She reports that she does not use illicit drugs.   Family History:  The patient's  family history includes Cancer in her father; Heart attack in her mother.    ROS:  Please see the history of present illness.   Otherwise, review of systems are positive for difficulty with balance.   All other systems are reviewed and negative.    PHYSICAL EXAM: VS:  BP 140/92 mmHg  Pulse 98  Ht 4\' 11"  (1.499 m)  Wt 141 lb (63.957 kg)  BMI 28.46 kg/m2  SpO2 95% , BMI Body mass index is 28.46 kg/(m^2). GEN: Well nourished, well developed, in no acute distress HEENT: normal Neck: no JVD, carotid bruits, or masses Cardiac: RRR.  There is no murmur, rub, or gallop. There is no edema. Respiratory:  clear to auscultation bilaterally, normal work of breathing. GI: soft, nontender, nondistended, + BS MS: no deformity or atrophy Skin: warm and dry, no rash Neuro:  Strength and sensation are intact Psych: euthymic mood, full affect   EKG:  EKG is not ordered today.    Recent Labs: No results found for requested labs within last 365 days.    Lipid Panel No results found for: CHOL, TRIG, HDL, CHOLHDL, VLDL, LDLCALC, LDLDIRECT    Wt Readings from Last 3 Encounters:  01/29/15 141 lb (63.957 kg)  01/29/15 143 lb (64.864 kg)  01/02/13 138 lb 6.4 oz (62.778 kg)      Other  studies Reviewed: Additional studies/ records that were reviewed today include: Reviewed pacer records.. The findings include no revealing data.    ASSESSMENT AND PLAN:  1. Chronic diastolic heart failure (HCC) No volume overload.  2. Second degree AV block, Mobitz type II Normal pacer function  3. Elevated BP Controlled  4. Pacemaker  Recent interrogation supposedly normal.  Current medicines are reviewed at length with the patient today.  The patient has the following concerns regarding medicines: none.  The following changes/actions have been instituted:    No diagnostic studies  Setup transtelephonic pacer f/u  Labs/ tests ordered today include:  No orders of the defined types were placed  in this encounter.     Disposition:   FU with HS in 1 year  Signed, Sinclair Grooms, MD  01/29/2015 7:03 PM    [address] [place], [place], [place]  [address] Phone: [phone]; Fax: [phone]

## 2015-01-29 NOTE — Patient Instructions (Addendum)
Medication Instructions:   Your physician recommends that you continue on your current medications as directed. Please refer to the Current Medication list given to you today.     If you need a refill on your cardiac medications before your next appointment, please call your pharmacy.  Labwork: NONE ORDER TODAY    Testing/Procedures:  NONE ORDER TODAY    Follow-Up:  Remote monitoring is used to monitor your Pacemaker of ICD from home. This monitoring reduces the number of office visits required to check your device to one time per year. It allows [place] to keep an eye on the functioning of your device to ensure it is working properly. You are scheduled for a device check from home on .04/29/15.. You may send your transmission at any time that day. If you have a wireless device, the transmission will be sent automatically. After your physician reviews your transmission, you will receive a postcard with your next transmission date.  Your physician wants you to follow-up in: [place] will receive a reminder letter in the mail two months in advance. If you don't receive a letter, please call our office to schedule the follow-up appointment.     Any Other Special Instructions Will Be Listed Below (If Applicable).

## 2015-01-29 NOTE — Patient Instructions (Signed)

## 2015-04-30 ENCOUNTER — Telehealth: Payer: Self-pay | Admitting: Cardiology

## 2015-04-30 ENCOUNTER — Encounter: Payer: Medicare Other | Admitting: *Deleted

## 2015-04-30 NOTE — Telephone Encounter (Signed)
Spoke with pt and reminded pt of remote transmission that is due today. Pt verbalized understanding.   

## 2015-05-03 ENCOUNTER — Encounter: Payer: Self-pay | Admitting: Cardiology

## 2015-05-10 ENCOUNTER — Ambulatory Visit (INDEPENDENT_AMBULATORY_CARE_PROVIDER_SITE_OTHER): Payer: Medicare Other | Admitting: *Deleted

## 2015-05-10 DIAGNOSIS — I441 Atrioventricular block, second degree: Secondary | ICD-10-CM | POA: Diagnosis not present

## 2015-05-10 NOTE — Progress Notes (Signed)
Remote pacemaker transmission.   

## 2015-06-07 ENCOUNTER — Encounter: Payer: Self-pay | Admitting: Cardiology

## 2015-06-07 LAB — CUP PACEART REMOTE DEVICE CHECK
Battery Remaining Percentage: 81 %
Battery Voltage: 2.93 V
Brady Statistic AS VS Percent: 13 %
Implantable Lead Implant Date: 20140124
Implantable Lead Location: 753859
Implantable Lead Location: 753860
Lead Channel Pacing Threshold Amplitude: 0.625 V
Lead Channel Pacing Threshold Pulse Width: 0.4 ms
Lead Channel Pacing Threshold Pulse Width: 0.4 ms
Lead Channel Sensing Intrinsic Amplitude: 5 mV
Lead Channel Setting Pacing Amplitude: 0.75 V
MDC IDC LEAD IMPLANT DT: 20140124
MDC IDC MSMT BATTERY REMAINING LONGEVITY: 115 mo
MDC IDC MSMT LEADCHNL RA IMPEDANCE VALUE: 340 Ohm
MDC IDC MSMT LEADCHNL RV IMPEDANCE VALUE: 690 Ohm
MDC IDC MSMT LEADCHNL RV PACING THRESHOLD AMPLITUDE: 0.5 V
MDC IDC MSMT LEADCHNL RV SENSING INTR AMPL: 6.1 mV
MDC IDC PG SERIAL: 7431184
MDC IDC SESS DTM: 20170324150428
MDC IDC SET LEADCHNL RA PACING AMPLITUDE: 1.625
MDC IDC SET LEADCHNL RV PACING PULSEWIDTH: 0.4 ms
MDC IDC SET LEADCHNL RV SENSING SENSITIVITY: 2 mV
MDC IDC STAT BRADY AP VP PERCENT: 1 %
MDC IDC STAT BRADY AP VS PERCENT: 1.2 %
MDC IDC STAT BRADY AS VP PERCENT: 85 %
MDC IDC STAT BRADY RA PERCENT PACED: 1.3 %
MDC IDC STAT BRADY RV PERCENT PACED: 86 %

## 2015-06-21 ENCOUNTER — Encounter: Payer: Self-pay | Admitting: Cardiology

## 2015-08-09 ENCOUNTER — Encounter: Payer: Medicare Other | Admitting: *Deleted

## 2015-08-09 ENCOUNTER — Telehealth: Payer: Self-pay | Admitting: Cardiology

## 2015-08-09 NOTE — Telephone Encounter (Signed)
LMOVM reminding pt to send remote transmission.   

## 2015-08-12 ENCOUNTER — Encounter: Payer: Self-pay | Admitting: Cardiology

## 2016-04-06 ENCOUNTER — Encounter: Payer: Self-pay | Admitting: Interventional Cardiology

## 2016-04-06 ENCOUNTER — Ambulatory Visit (INDEPENDENT_AMBULATORY_CARE_PROVIDER_SITE_OTHER): Payer: Medicare Other | Admitting: Interventional Cardiology

## 2016-04-06 VITALS — BP 138/92 | HR 91 | Ht 59.0 in | Wt 139.0 lb

## 2016-04-06 DIAGNOSIS — I5033 Acute on chronic diastolic (congestive) heart failure: Secondary | ICD-10-CM | POA: Diagnosis not present

## 2016-04-06 DIAGNOSIS — I441 Atrioventricular block, second degree: Secondary | ICD-10-CM

## 2016-04-06 DIAGNOSIS — Z950 Presence of cardiac pacemaker: Secondary | ICD-10-CM

## 2016-04-06 NOTE — Progress Notes (Signed)
Cardiology Office Note    Date:  04/06/2016   ID:  Robin [name], DOB April 16, 1928, MRN [id number]  PCP:  No PCP Per Patient  Cardiologist: Sinclair Grooms, MD   Chief Complaint  Patient presents with  . Shortness of Breath  . Follow-up    Pacer    History of Present Illness:  Robin [name] is a 83 y.o. female  who presents for intrinsic conduction system disease, permanent pacemaker, and  chronic diastolic heart failure.   She is doing fine. She doesn't feel that her device has been checked more than once in the past 4 years. In reviewing the records, Robin [name]  [facility] one year ago. Overall she is doing quite well. She denies exertional fatigue. She is not had syncope or palpitations.  Her recollection of follow-up is at odds with what has actually occurred. She does not want to be followed in the device clinic. She does not want to see Dr. Caryl Comes in the future.   Past Medical History:  Diagnosis Date  . LBBB (left bundle branch block)-rate related   . Mobitz type 2 second degree AV block    2:1  with RBBB  . Narcolepsy   . Vulva cancer [facility])     Past Surgical History:  Procedure Laterality Date  . ABDOMINAL HYSTERECTOMY    . ABDOMINAL SURGERY     exploratory  . BOWEL RESECTION    . CHEST TUBE INSERTION  03/13/2012   Procedure: CHEST TUBE INSERTION;  Surgeon: Ivin Poot, MD;  Location: [facility];  Service: Thoracic;  Laterality: Right;  . HERNIA REPAIR    . MASTOIDECTOMY REVISION    . PERMANENT PACEMAKER INSERTION N/A 03/11/2012   Procedure: PERMANENT PACEMAKER INSERTION;  Surgeon: Deboraha Sprang, MD;  Location: [facility] CATH LAB;  Service: Cardiovascular;  Laterality: N/A;  . TONSILLECTOMY    . WRIST FRACTURE SURGERY      Current Medications: Outpatient Medications Prior to Visit  Medication Sig Dispense Refill  . aspirin EC 81 MG EC tablet Take 1 tablet (81 mg total) by mouth daily.     No facility-administered medications prior to visit.      Allergies:   Influenza a (h1n1) monoval pf; Other; and Penicillins   Social History   Social History  . Marital status: Single    Spouse name: N/A  . Number of children: N/A  . Years of education: N/A   Social History Main Topics  . Smoking status: Never Smoker  . Smokeless tobacco: Never Used  . Alcohol use Yes     Comment: seldom  . Drug use: No  . Sexual activity: No   Other Topics Concern  . None   Social History Narrative  . None     Family History:  The patient's family history includes Cancer in her father; Heart attack in her mother.   ROS:   Please see the history of present illness.    Musculoskeletal complaints but otherwise unremarkable.  All other systems reviewed and are negative.   PHYSICAL EXAM:   VS:  BP (!) 138/92 (BP Location: Left Arm)   Pulse 91   Ht 4\' 11"  (1.499 m)   Wt 139 lb (63 kg)   BMI 28.07 kg/m    GEN: Well nourished, well developed, in no acute distress . She appears younger than her stated age. HEENT: normal  Neck: no JVD, carotid bruits, or masses Cardiac: RRR; no murmurs, rubs, or gallops,no edema  Respiratory:  clear to auscultation bilaterally, normal work of breathing GI: soft, nontender, nondistended, + BS MS: no deformity or atrophy  Skin: warm and dry, no rash Neuro:  Alert and Oriented x 3, Strength and sensation are intact Psych: euthymic mood, full affect  Wt Readings from Last 3 Encounters:  04/06/16 139 lb (63 kg)  01/29/15 141 lb (64 kg)  01/29/15 143 lb (64.9 kg)      Studies/Labs Reviewed:   EKG:  EKG  Normal sinus rhythm/atrial pacing with long PR 226 ms. Ventricular pacing with atrial tracking.  Recent Labs: No results found for requested labs within last 8760 hours.   Lipid Panel No results found for: CHOL, TRIG, HDL, CHOLHDL, VLDL, LDLCALC, LDLDIRECT  Additional studies/ records that were reviewed today include:  St. Jude permanent pacemaker interrogation 04/06/2016: Rare atrial high rates  with longest being 3 minutes 2 seconds. Battery has anticipated longevity is 9 years. It appears that the patient's pacing 87% of the time.   ASSESSMENT:    1. Second degree AV block, Mobitz type II   2. S/P cardiac pacemaker procedure   3. Acute on chronic diastolic heart failure (HCC)      PLAN:  In order of problems listed above:  1. DDD pacemaker for second-degree AV block 4 years ago. Normal function but with sporadic pacer follow-up.Pacemaker insertion was complicated by bilateral pneumothoraces and chest tube placement. 2. Needs more routine device follow-up. Device was evaluated today. [place] see above. 3. No evidence of volume overload.  Establish in pacer clinic. Clinical follow-up with me in one year.    Medication Adjustments/Labs and Tests Ordered: Current medicines are reviewed at length with the patient today.  Concerns regarding medicines are outlined above.  Medication changes, Labs and Tests ordered today are listed in the Patient Instructions below. There are no Patient Instructions on file for this visit.   Signed, Sinclair Grooms, MD  04/06/2016 11:24 AM    [address], [place], [place]  [address] Phone: [phone]; Fax: [phone]

## 2016-04-06 NOTE — Patient Instructions (Signed)
Medication Instructions:  None  Labwork: None  Testing/Procedures: None  Follow-Up: Your physician recommends that you schedule a follow-up appointment: next available with Dr. Caryl Comes.   Your physician wants you to follow-up in: 1 year with Dr.Smith. You will receive a reminder letter in the mail two months in advance. If you don't receive a letter, please call our office to schedule the follow-up appointment.   Any Other Special Instructions Will Be Listed Below (If Applicable).     If you need a refill on your cardiac medications before your next appointment, please call your pharmacy.

## 2016-04-13 DIAGNOSIS — H52223 Regular astigmatism, bilateral: Secondary | ICD-10-CM | POA: Diagnosis not present

## 2016-04-13 DIAGNOSIS — H2513 Age-related nuclear cataract, bilateral: Secondary | ICD-10-CM | POA: Diagnosis not present

## 2016-04-13 DIAGNOSIS — H524 Presbyopia: Secondary | ICD-10-CM | POA: Diagnosis not present

## 2016-04-13 DIAGNOSIS — H5203 Hypermetropia, bilateral: Secondary | ICD-10-CM | POA: Diagnosis not present

## 2017-02-23 DIAGNOSIS — M19049 Primary osteoarthritis, unspecified hand: Secondary | ICD-10-CM | POA: Diagnosis not present

## 2017-02-23 DIAGNOSIS — M19041 Primary osteoarthritis, right hand: Secondary | ICD-10-CM | POA: Diagnosis not present

## 2017-09-03 ENCOUNTER — Encounter: Payer: Self-pay | Admitting: Cardiology

## 2017-12-21 ENCOUNTER — Ambulatory Visit (INDEPENDENT_AMBULATORY_CARE_PROVIDER_SITE_OTHER): Payer: Medicare Other | Admitting: Internal Medicine

## 2017-12-21 ENCOUNTER — Encounter: Payer: Self-pay | Admitting: Internal Medicine

## 2017-12-21 DIAGNOSIS — I441 Atrioventricular block, second degree: Secondary | ICD-10-CM | POA: Diagnosis not present

## 2017-12-21 DIAGNOSIS — Z950 Presence of cardiac pacemaker: Secondary | ICD-10-CM

## 2017-12-21 NOTE — Progress Notes (Signed)
HPI Robin [name] returns today to re-establish after a long absence from our arrhythmia clinic. She is referred by Dr. Tamala Julian.  She has a h/o 2:1 AV block and underwent PPM insertion several years ago. The procedure was complicated by bilateral pneumothorax. She had bilateral chest tubes and a residual air leak which eventually resolved with re-expansion of her lung. The patient was not seen in our arrhythmia clinic for over 4 years. During that time she was stable. She denies chest pain. She has chronic dyspnea with exertion. She has not had syncope. She has dyspnea on exertion and has known diastolic heart failure which appears to be well compensated.  Allergies  Allergen Reactions  . Influenza A (H1n1) Monoval Pf   . Penicillamine   . Penicillin G     Other reaction(s): Other (See Comments) RASH  . Other Rash    SWELLING flu vaccine  . Penicillins Hives and Rash     Current Outpatient Medications  Medication Sig Dispense Refill  . acetaminophen (TYLENOL 8 HOUR ARTHRITIS PAIN) 650 MG CR tablet Take 650 mg by mouth every 8 (eight) hours as needed for pain.    Marland Kitchen aspirin EC 81 MG EC tablet Take 1 tablet (81 mg total) by mouth daily.    Marland Kitchen CRANBERRY-VITAMIN C PO Take 2 tablets by mouth 2 (two) times daily. 4200mg  +Vitamin C    . diclofenac sodium (VOLTAREN) 1 % GEL Apply 2 g topically 4 (four) times daily as needed.    . Echinacea 400 MG CAPS Take 400 mg by mouth 2 (two) times daily.    Marland Kitchen ibuprofen (ADVIL,MOTRIN) 200 MG tablet Take 200 mg by mouth every 6 (six) hours as needed for headache, mild pain or moderate pain.    . naproxen sodium (ALEVE) 220 MG tablet Take 220 mg by mouth daily as needed.     No current facility-administered medications for this visit.      Past Medical History:  Diagnosis Date  . LBBB (left bundle branch block)-rate related   . Mobitz type 2 second degree AV block    2:1  with RBBB  . Narcolepsy   . Vulva cancer ([place])     ROS:   All systems  reviewed and negative except as noted in the HPI.   Past Surgical History:  Procedure Laterality Date  . ABDOMINAL HYSTERECTOMY    . ABDOMINAL SURGERY     exploratory  . BOWEL RESECTION    . CHEST TUBE INSERTION  03/13/2012   Procedure: CHEST TUBE INSERTION;  Surgeon: Ivin Poot, MD;  Location: [facility];  Service: Thoracic;  Laterality: Right;  . HERNIA REPAIR    . MASTOIDECTOMY REVISION    . PERMANENT PACEMAKER INSERTION N/A 03/11/2012   Procedure: PERMANENT PACEMAKER INSERTION;  Surgeon: Deboraha Sprang, MD;  Location: [facility] CATH LAB;  Service: Cardiovascular;  Laterality: N/A;  . TONSILLECTOMY    . WRIST FRACTURE SURGERY       Family History  Problem Relation Age of Onset  . Heart attack Mother   . Cancer Father      Social History   Socioeconomic History  . Marital status: Single    Spouse name: Not on file  . Number of children: Not on file  . Years of education: Not on file  . Highest education level: Not on file  Occupational History  . Not on file  Social Needs  . Financial resource strain: Not on file  . Food  insecurity:    Worry: Not on file    Inability: Not on file  . Transportation needs:    Medical: Not on file    Non-medical: Not on file  Tobacco Use  . Smoking status: Never Smoker  . Smokeless tobacco: Never Used  Substance and Sexual Activity  . Alcohol use: Yes    Comment: seldom  . Drug use: No  . Sexual activity: Never  Lifestyle  . Physical activity:    Days per week: Not on file    Minutes per session: Not on file  . Stress: Not on file  Relationships  . Social connections:    Talks on phone: Not on file    Gets together: Not on file    Attends religious service: Not on file    Active member of club or organization: Not on file    Attends meetings of clubs or organizations: Not on file    Relationship status: Not on file  . Intimate partner violence:    Fear of current or ex partner: Not on file    Emotionally abused: Not on file      Physically abused: Not on file    Forced sexual activity: Not on file  Other Topics Concern  . Not on file  Social History Narrative  . Not on file     BP 132/80   Pulse 92   Ht 4\' 11"  (1.499 m)   Wt 123 lb (55.8 kg)   BMI 24.84 kg/m   Physical Exam:  Well appearing NAD HEENT: Unremarkable Neck:  No JVD, no thyromegally Lymphatics:  No adenopathy Back:  No CVA tenderness Lungs:  Clear with no wheezes HEART:  Regular rate rhythm, no murmurs, no rubs, no clicks Abd:  soft, positive bowel sounds, no organomegally, no rebound, no guarding Ext:  2 plus pulses, no edema, no cyanosis, no clubbing Skin:  No rashes no nodules Neuro:  CN II through XII intact, motor grossly intact  EKG - NSR with ventricular pacing  DEVICE  Normal device function.  See PaceArt for details.   Assess/Plan: 1. CHB/2:1 AV block - she is asymptomatic, s/p PPM insertion. 2. Diastolic heart failure - her symptoms are class 2. She will continue her current meds and is encouraged to maintain a low sodium diet. 3. PPM - her St. Jude DDD PM is working normally. We will recheck in several months.  Mikle Bosworth.D.

## 2017-12-21 NOTE — Patient Instructions (Signed)
Medication Instructions:  No change  Labwork: none  Testing/Procedures: none  Follow-Up: Your physician wants you to follow-up in: 1 year with Dr. Lovena Le.  You will receive a reminder letter in the mail two months in advance. If you don't receive a letter, please call our office to schedule the follow-up appointment.   Any Other Special Instructions Will Be Listed Below (If Applicable).     If you need a refill on your cardiac medications before your next appointment, please call your pharmacy.

## 2017-12-22 LAB — CUP PACEART INCLINIC DEVICE CHECK
Battery Voltage: 2.92 V
Brady Statistic RA Percent Paced: 1.2 %
Brady Statistic RV Percent Paced: 77 %
Date Time Interrogation Session: 20191105175658
Implantable Lead Location: 753859
Implantable Lead Location: 753860
Lead Channel Impedance Value: 587.5 Ohm
Lead Channel Pacing Threshold Amplitude: 0.5 V
Lead Channel Pacing Threshold Amplitude: 0.75 V
Lead Channel Pacing Threshold Pulse Width: 0.4 ms
Lead Channel Sensing Intrinsic Amplitude: 3.4 mV
Lead Channel Setting Pacing Pulse Width: 0.4 ms
Lead Channel Setting Sensing Sensitivity: 2 mV
MDC IDC LEAD IMPLANT DT: 20140124
MDC IDC LEAD IMPLANT DT: 20140124
MDC IDC MSMT BATTERY REMAINING LONGEVITY: 104 mo
MDC IDC MSMT LEADCHNL RA IMPEDANCE VALUE: 312.5 Ohm
MDC IDC MSMT LEADCHNL RA PACING THRESHOLD AMPLITUDE: 0.75 V
MDC IDC MSMT LEADCHNL RA PACING THRESHOLD PULSEWIDTH: 0.4 ms
MDC IDC MSMT LEADCHNL RV PACING THRESHOLD AMPLITUDE: 0.5 V
MDC IDC MSMT LEADCHNL RV PACING THRESHOLD PULSEWIDTH: 0.4 ms
MDC IDC MSMT LEADCHNL RV PACING THRESHOLD PULSEWIDTH: 0.4 ms
MDC IDC MSMT LEADCHNL RV SENSING INTR AMPL: 5.7 mV
MDC IDC PG IMPLANT DT: 20140124
MDC IDC SET LEADCHNL RA PACING AMPLITUDE: 1.75 V
MDC IDC SET LEADCHNL RV PACING AMPLITUDE: 0.75 V
Pulse Gen Model: 2110
Pulse Gen Serial Number: 7431184

## 2018-06-30 ENCOUNTER — Encounter: Payer: Medicare Other | Admitting: *Deleted

## 2018-06-30 ENCOUNTER — Other Ambulatory Visit: Payer: Self-pay

## 2018-07-01 ENCOUNTER — Telehealth: Payer: Self-pay

## 2018-07-01 NOTE — Telephone Encounter (Signed)
Spoke with patient to remind of missed remote transmission 

## 2018-07-05 ENCOUNTER — Encounter: Payer: Self-pay | Admitting: Cardiology

## 2022-08-28 ENCOUNTER — Other Ambulatory Visit: Payer: Self-pay

## 2022-08-28 ENCOUNTER — Emergency Department (HOSPITAL_BASED_OUTPATIENT_CLINIC_OR_DEPARTMENT_OTHER)
Admission: EM | Admit: 2022-08-28 | Discharge: 2022-08-28 | Disposition: A | Discharge status: Home / Self Care | Payer: Medicare Other | Attending: Emergency Medicine | Admitting: Emergency Medicine

## 2022-08-28 ENCOUNTER — Emergency Department (HOSPITAL_BASED_OUTPATIENT_CLINIC_OR_DEPARTMENT_OTHER): Payer: Medicare Other | Admitting: Radiology

## 2022-08-28 ENCOUNTER — Encounter (HOSPITAL_BASED_OUTPATIENT_CLINIC_OR_DEPARTMENT_OTHER): Payer: Self-pay | Admitting: Emergency Medicine

## 2022-08-28 DIAGNOSIS — S81011A Laceration without foreign body, right knee, initial encounter: Secondary | ICD-10-CM | POA: Diagnosis not present

## 2022-08-28 DIAGNOSIS — W1839XA Other fall on same level, initial encounter: Secondary | ICD-10-CM | POA: Insufficient documentation

## 2022-08-28 DIAGNOSIS — Z7982 Long term (current) use of aspirin: Secondary | ICD-10-CM | POA: Diagnosis not present

## 2022-08-28 DIAGNOSIS — S8991XA Unspecified injury of right lower leg, initial encounter: Secondary | ICD-10-CM | POA: Diagnosis present

## 2022-08-28 MED ORDER — OXYCODONE HCL 5 MG PO TABS
2.5000 mg | ORAL_TABLET | Freq: Once | ORAL | Status: AC
  Administered 2022-08-28: 2.5 mg via ORAL
  Filled 2022-08-28: qty 1

## 2022-08-28 MED ORDER — LIDOCAINE-EPINEPHRINE (PF) 2 %-1:200000 IJ SOLN
10.0000 mL | Freq: Once | INTRAMUSCULAR | Status: AC
  Administered 2022-08-28: 10 mL via INTRADERMAL
  Filled 2022-08-28: qty 20

## 2022-08-28 MED ORDER — CEPHALEXIN 500 MG PO CAPS: ORAL_CAPSULE | ORAL | 0 refills | Status: AC

## 2022-08-28 MED ORDER — CEPHALEXIN 250 MG PO CAPS
1000.0000 mg | ORAL_CAPSULE | Freq: Once | ORAL | Status: AC
  Administered 2022-08-28: 1000 mg via ORAL
  Filled 2022-08-28: qty 4

## 2022-08-28 MED ORDER — ACETAMINOPHEN 500 MG PO TABS
1000.0000 mg | ORAL_TABLET | Freq: Once | ORAL | Status: AC
  Administered 2022-08-28: 1000 mg via ORAL
  Filled 2022-08-28: qty 2

## 2022-08-28 NOTE — ED Triage Notes (Signed)
Pt arrives pov, to triage in wheelchair, endorses mechanical today. Pt c/o bilateral knee pain and abrasions, RT knee wrapped with gauze pta. Abrasions to bilateral UE. Referred to ED by UC. Denies thinners, endorses tetanus UTD

## 2022-08-28 NOTE — ED Notes (Signed)
Pt denies head injury. GCS 15, ao x 4

## 2022-08-28 NOTE — ED Provider Notes (Signed)
[place] EMERGENCY DEPARTMENT AT [facility] Provider Note   CSN: [id number] Arrival date & time: 08/28/22  1607     History  Chief Complaint  Patient presents with   Robin [name] is a 89 y.o. female.  89 yo F with a chief complaints of right knee pain.  The patient had been going to get the mail and she lost her balance and fell onto the right knee.  She suffered a significant laceration to the area.  Her tetanus is up-to-date and she denies head injury denies neck pain chest pain abdominal pain back pain.  Was able to ambulate on it afterwards.   Fall       Home Medications Prior to Admission medications   Medication Sig Start Date End Date Taking? Authorizing Provider  cephALEXin (KEFLEX) 500 MG capsule 2 caps po bid x 7 days 08/28/22  Yes Melene Plan, DO  acetaminophen (TYLENOL 8 HOUR ARTHRITIS PAIN) 650 MG CR tablet Take 650 mg by mouth every 8 (eight) hours as needed for pain.    [provider]  aspirin EC 81 MG EC tablet Take 1 tablet (81 mg total) by mouth daily. 03/22/12   Lyn Records, MD  CRANBERRY-VITAMIN C PO Take 2 tablets by mouth 2 (two) times daily. 4200mg  +Vitamin C    [provider]  diclofenac sodium (VOLTAREN) 1 % GEL Apply 2 g topically 4 (four) times daily as needed.    [provider]  Echinacea 400 MG CAPS Take 400 mg by mouth 2 (two) times daily.    [provider]  ibuprofen (ADVIL,MOTRIN) 200 MG tablet Take 200 mg by mouth every 6 (six) hours as needed for headache, mild pain or moderate pain.    [provider]  naproxen sodium (ALEVE) 220 MG tablet Take 220 mg by mouth daily as needed.    [provider]      Allergies    Influenza a (h1n1) monoval pf, Penicillamine, Penicillin g, Other, and Penicillins    Review of Systems   Review of Systems  Physical Exam Updated Vital Signs BP (!) 152/86 (BP Location: Right Arm)   Pulse (!) 112   Temp 98.3 F (36.8  C)   Resp 18   Ht 4\' 9"  (1.448 m)   Wt 54.4 kg   SpO2 98%   BMI 25.97 kg/m  Physical Exam Vitals and nursing note reviewed.  Constitutional:      General: She is not in acute distress.    Appearance: She is well-developed. She is not diaphoretic.  HENT:     Head: Normocephalic and atraumatic.  Eyes:     Pupils: Pupils are equal, round, and reactive to light.  Cardiovascular:     Rate and Rhythm: Normal rate and regular rhythm.     Heart sounds: No murmur heard.    No friction rub. No gallop.  Pulmonary:     Effort: Pulmonary effort is normal.     Breath sounds: No wheezing or rales.  Abdominal:     General: There is no distension.     Palpations: Abdomen is soft.     Tenderness: There is no abdominal tenderness.  Musculoskeletal:        General: No tenderness.     Cervical back: Normal range of motion and neck supple.     Comments: Patient has dissection of the skin down around the joint capsule of the right knee.  There is no obvious  penetration into the joint capsule.  I am able to range it without any obvious areas of disruption.  Skin:    General: Skin is warm and dry.  Neurological:     Mental Status: She is alert and oriented to person, place, and time.  Psychiatric:        Behavior: Behavior normal.     ED Results / Procedures / Treatments   Labs (all labs ordered are listed, but only abnormal results are displayed) Labs Reviewed - No data to display  EKG EKG Interpretation Date/Time:  Friday August 28 2022 16:18:34 EDT Ventricular Rate:  104 PR Interval:  172 QRS Duration:  168 QT Interval:  392 QTC Calculation: 515 R Axis:   -64  Text Interpretation: Atrial-sensed ventricular-paced rhythm Abnormal ECG No significant change since last tracing Confirmed by Melene Plan [phone]) on 08/28/2022 4:54:36 PM  Radiology DG Knee Complete 4 Views Right  Result Date: 08/28/2022 CLINICAL DATA:  Patient fell landing on the right knee. Skin separation with exposure of  the right knee joint. EXAM: RIGHT KNEE - COMPLETE 4+ VIEW COMPARISON:  None Available. FINDINGS: Degenerative changes in the right knee with medial compartment narrowing and small osteophyte formation. No acute fracture or dislocation is identified. Visualization of the patella and suprapatellar region is limited due to patient positioning but no obvious right knee effusion is identified. Soft tissue defect laterally over the patella appears to extend to the bone surface. Vascular calcifications in the soft tissues. No radiopaque soft tissue foreign bodies. IMPRESSION: 1. Soft tissue laceration identified anteriorly appears to extend to the patellar surface. 2. No acute bony abnormalities. 3. Mild degenerative changes in the medial compartment. Electronically Signed   By: Burman Nieves M.D.   On: 08/28/2022 17:30    Procedures .Marland KitchenLaceration Repair  Date/Time: 08/28/2022 6:39 PM  Performed by: Melene Plan, DO Authorized by: Melene Plan, DO   Consent:    Consent obtained:  Verbal   Consent given by:  Patient   Risks, benefits, and alternatives were discussed: yes     Risks discussed:  Infection, pain, poor cosmetic result and poor wound healing   Alternatives discussed:  No treatment Universal protocol:    Procedure explained and questions answered to patient or proxy's satisfaction: yes     Imaging studies available: yes     Immediately prior to procedure, a time out was called: yes     Patient identity confirmed:  Verbally with patient Anesthesia:    Anesthesia method:  Local infiltration   Local anesthetic:  Lidocaine 2% WITH epi Laceration details:    Location:  Leg   Leg location:  R knee   Length (cm):  18 Pre-procedure details:    Preparation:  Patient was prepped and draped in usual sterile fashion Exploration:    Hemostasis achieved with:  Epinephrine and direct pressure   Imaging obtained: x-ray     Imaging outcome: foreign body not noted     Wound exploration: wound explored  through full range of motion and entire depth of wound visualized     Wound extent: foreign bodies/material     Foreign bodies/material:  Concrete   Contaminated: yes   Treatment:    Area cleansed with:  Chlorhexidine   Amount of cleaning:  Standard   Irrigation solution:  Sterile saline   Irrigation volume:  2000   Irrigation method:  Pressure wash   Visualized foreign bodies/material removed: yes     Debridement:  None   Undermining:  None   Scar revision: no     Layers/structures repaired:  Muscle fascia Muscle fascia:    Suture size:  3-0   Suture material:  Chromic gut   Suture technique:  Simple interrupted   Number of sutures:  2 Skin repair:    Repair method:  Sutures   Suture size:  3-0   Suture material:  Nylon   Suture technique:  Simple interrupted   Number of sutures:  15 Approximation:    Approximation:  Close Repair type:    Repair type:  Complex Post-procedure details:    Dressing:  Antibiotic ointment and adhesive bandage   Procedure completion:  Tolerated well, no immediate complications     Medications Ordered in ED Medications  lidocaine-EPINEPHrine (XYLOCAINE W/EPI) 2 %-1:200000 (PF) injection 10 mL (10 mLs Intradermal Given 08/28/22 1717)  acetaminophen (TYLENOL) tablet 1,000 mg (1,000 mg Oral Given 08/28/22 1716)  oxyCODONE (Oxy IR/ROXICODONE) immediate release tablet 2.5 mg (2.5 mg Oral Given 08/28/22 1716)  cephALEXin (KEFLEX) capsule 1,000 mg (1,000 mg Oral Given 08/28/22 1812)    ED Course/ Medical Decision Making/ A&P                             Medical Decision Making Amount and/or Complexity of Data Reviewed Radiology: ordered.  Risk OTC drugs. Prescription drug management.   89 yo F with a chief complaint of a right knee laceration.  Unfortunately this has dissected all the layers of skin down to the knee capsule.  Luckily I do not see any obvious penetration into the knee capsule.  Wound was repaired at bedside after being  thoroughly irrigated.  I am going to start her on prophylactic antibiotics.  Orthopedic follow-up was given.  She is placed in an Ace wrap for support.  6:41 PM:  I have discussed the diagnosis/risks/treatment options with the patient.  Evaluation and diagnostic testing in the emergency department does not suggest an emergent condition requiring admission or immediate intervention beyond what has been performed at this time.  They will follow up with PCP. We also discussed returning to the ED immediately if new or worsening sx occur. We discussed the sx which are most concerning (e.g., sudden worsening pain, fever, inability to tolerate by mouth) that necessitate immediate return. Medications administered to the patient during their visit and any new prescriptions provided to the patient are listed below.  Medications given during this visit Medications  lidocaine-EPINEPHrine (XYLOCAINE W/EPI) 2 %-1:200000 (PF) injection 10 mL (10 mLs Intradermal Given 08/28/22 1717)  acetaminophen (TYLENOL) tablet 1,000 mg (1,000 mg Oral Given 08/28/22 1716)  oxyCODONE (Oxy IR/ROXICODONE) immediate release tablet 2.5 mg (2.5 mg Oral Given 08/28/22 1716)  cephALEXin (KEFLEX) capsule 1,000 mg (1,000 mg Oral Given 08/28/22 1812)     The patient appears reasonably screen and/or stabilized for discharge and I doubt any other medical condition or other [facility] requiring further screening, evaluation, or treatment in the ED at this time prior to discharge.          Final Clinical Impression(s) / ED Diagnoses Final diagnoses:  Knee laceration, right, initial encounter    Rx / DC Orders ED Discharge Orders          Ordered    cephALEXin (KEFLEX) 500 MG capsule        08/28/22 1802              Melene Plan, DO 08/28/22 1841

## 2022-08-28 NOTE — Discharge Instructions (Addendum)
Return for redness drainage or if you get a fever.  The stitches placed need to be removed.  This typically is done between day 10 and 14.  This can be done here at urgent care or at a family doctor's office.  I have given you information to follow-up with the orthopedist and perhaps they can take them out for you.  The area can get wet but not fully immersed underwater.  No scrubbing.  If you really want to clean it you can apply a half-and-half hydrogen peroxide solution with water on a Q-tip.  You can apply an ointment a couple times a day this could be as simple as Vaseline but could also be an antibiotic ointment if you wish.  Once it is healed please try to avoid prolonged sun exposure use sunscreen.  Gells that have silicone antigens have been shown to reduce scarring in some research.

## 2022-09-05 ENCOUNTER — Emergency Department (HOSPITAL_BASED_OUTPATIENT_CLINIC_OR_DEPARTMENT_OTHER): Payer: Medicare Other

## 2022-09-05 ENCOUNTER — Encounter (HOSPITAL_BASED_OUTPATIENT_CLINIC_OR_DEPARTMENT_OTHER): Payer: Self-pay

## 2022-09-05 ENCOUNTER — Emergency Department (HOSPITAL_BASED_OUTPATIENT_CLINIC_OR_DEPARTMENT_OTHER)
Admission: EM | Admit: 2022-09-05 | Discharge: 2022-09-05 | Disposition: A | Discharge status: Home / Self Care | Payer: Medicare Other | Attending: Emergency Medicine | Admitting: Emergency Medicine

## 2022-09-05 ENCOUNTER — Other Ambulatory Visit: Payer: Self-pay

## 2022-09-05 DIAGNOSIS — Z7982 Long term (current) use of aspirin: Secondary | ICD-10-CM | POA: Diagnosis not present

## 2022-09-05 DIAGNOSIS — Z8544 Personal history of malignant neoplasm of other female genital organs: Secondary | ICD-10-CM | POA: Insufficient documentation

## 2022-09-05 DIAGNOSIS — M7989 Other specified soft tissue disorders: Secondary | ICD-10-CM | POA: Diagnosis present

## 2022-09-05 DIAGNOSIS — L03115 Cellulitis of right lower limb: Secondary | ICD-10-CM | POA: Diagnosis not present

## 2022-09-05 LAB — BASIC METABOLIC PANEL
Anion gap: 10 (ref 5–15)
BUN: 10 mg/dL (ref 8–23)
CO2: 28 mmol/L (ref 22–32)
Calcium: 9.5 mg/dL (ref 8.9–10.3)
Chloride: 103 mmol/L (ref 98–111)
Creatinine, Ser: 0.72 mg/dL (ref 0.44–1.00)
GFR, Estimated: >60 mL/min (ref >60)
Glucose, Bld: 95 mg/dL (ref 70–99)
Potassium: 4.2 mmol/L (ref 3.5–5.1)
Sodium: 141 mmol/L (ref 135–145)

## 2022-09-05 LAB — CBC WITH DIFFERENTIAL/PLATELET
Abs Immature Granulocytes: 0.02 10*3/uL (ref 0.00–0.07)
Basophils Absolute: 0 10*3/uL (ref 0.0–0.1)
Basophils Relative: 0 %
Eosinophils Absolute: 0.2 10*3/uL (ref 0.0–0.5)
Eosinophils Relative: 3 %
HCT: 35.7 % — ABNORMAL LOW (ref 36.0–46.0)
Hemoglobin: 12 g/dL (ref 12.0–15.0)
Immature Granulocytes: 0 %
Lymphocytes Relative: 26 %
Lymphs Abs: 1.6 10*3/uL (ref 0.7–4.0)
MCH: 30.4 pg (ref 26.0–34.0)
MCHC: 33.6 g/dL (ref 30.0–36.0)
MCV: 90.4 fL (ref 80.0–100.0)
Monocytes Absolute: 0.5 10*3/uL (ref 0.1–1.0)
Monocytes Relative: 9 %
Neutro Abs: 3.9 10*3/uL (ref 1.7–7.7)
Neutrophils Relative %: 62 %
Platelets: 290 10*3/uL (ref 150–400)
RBC: 3.95 MIL/uL (ref 3.87–5.11)
RDW: 12.8 % (ref 11.5–15.5)
WBC: 6.2 10*3/uL (ref 4.0–10.5)
nRBC: 0 % (ref 0.0–0.2)

## 2022-09-05 MED ORDER — CEFAZOLIN SODIUM-DEXTROSE 1-4 GM/50ML-% IV SOLN
1.0000 g | Freq: Once | INTRAVENOUS | Status: AC
  Administered 2022-09-05: 1 g via INTRAVENOUS
  Filled 2022-09-05: qty 50

## 2022-09-05 MED ORDER — DOXYCYCLINE HYCLATE 100 MG PO CAPS: 100.0000 mg | ORAL_CAPSULE | Freq: Two times a day (BID) | ORAL | 0 refills | Status: AC

## 2022-09-05 MED ORDER — SODIUM CHLORIDE 0.9 % IV SOLN: INTRAVENOUS | Status: DC | PRN

## 2022-09-05 NOTE — ED Triage Notes (Signed)
She states she was here ~ 5 days ago and underwent suturing of an ant. Right knee laceration. She is here today because "It is still really red". She is able to ambulate and is in no distress. She denies fever, nor any other sign of current illness. I visualize no proximal red streaking of right knee.

## 2022-09-05 NOTE — ED Notes (Signed)
Pt received AVS; Wound/redness outlined prior to d/c and pt/family educated on monitor symptoms of infections. Education on antibiotic usage and s/s on when to come back to ED for eval; pt and family verbalized understanding and had no further question upon discharge.

## 2022-09-05 NOTE — ED Notes (Signed)
Pt provided a warm blanket for comfort; family at bedside, call light in reach.

## 2022-09-05 NOTE — Discharge Instructions (Addendum)
You have been started on antibiotics for skin infection around your laceration.  Please take this new antibiotic for the entire course.  Please be aware that it may make you more sensitive to the sun therefore your skin should be covered if you choose to go outside while you are taking this medication.  An ultrasound to rule out blood clot in your leg has been ordered and scheduled for tomorrow.  Please present to [facility] as discussed to complete this imaging study.  You should return to the emergency department immediately if the redness, swelling, pain in the area worsens despite antibiotics, you begin to have puslike drainage from the wound, or you develop any fevers.  Please return as previously discussed for suture removal on Friday.  You may return to the ER for follow-up or may follow-up with the orthopedist listed below.

## 2022-09-05 NOTE — ED Provider Notes (Signed)
[place] EMERGENCY DEPARTMENT AT [facility] Provider Note   CSN: [id number] Arrival date & time: 09/05/22  1756     History  Chief Complaint  Patient presents with   Wound Check    Robin [name] is a 89 y.o. female 89 year old female who presents with concern for redness swelling and discomfort to the right leg at site of laceration repaired in the ED on 08/28/2022.  Patient reportedly fell while getting the mail on 7/12 and had large laceration over the anterior right knee down to the joint capsule without invasion of the joint space.  Was irrigated thoroughly patient was initiated on Keflex after repair which she completed today.  Brought into the ED by her son who reports that he returned from a trip and found her to be with significant redness and swelling in the right leg.  Denies fevers at home.  Patient denies taking any medication for pain at home.  Patient reportedly with history of vulvar cancer, Mobitz type II heart block and narcolepsy.  She is not usually taking medications daily and states that she does not see a doctor anymore" because they have had enough of them in my life".  HPI     Home Medications Prior to Admission medications   Medication Sig Start Date End Date Taking? Authorizing Provider  doxycycline (VIBRAMYCIN) 100 MG capsule Take 1 capsule (100 mg total) by mouth 2 (two) times daily. 09/05/22  Yes Samora Jernberg, Eugene Gavia, PA-C  acetaminophen (TYLENOL 8 HOUR ARTHRITIS PAIN) 650 MG CR tablet Take 650 mg by mouth every 8 (eight) hours as needed for pain.    [provider]  aspirin EC 81 MG EC tablet Take 1 tablet (81 mg total) by mouth daily. 03/22/12   Lyn Records, MD  cephALEXin (KEFLEX) 500 MG capsule 2 caps po bid x 7 days 08/28/22   Melene Plan, DO  CRANBERRY-VITAMIN C PO Take 2 tablets by mouth 2 (two) times daily. 4200mg  +Vitamin C    [provider]  diclofenac sodium (VOLTAREN) 1 % GEL Apply 2 g topically 4 (four)  times daily as needed.    [provider]  Echinacea 400 MG CAPS Take 400 mg by mouth 2 (two) times daily.    [provider]  ibuprofen (ADVIL,MOTRIN) 200 MG tablet Take 200 mg by mouth every 6 (six) hours as needed for headache, mild pain or moderate pain.    [provider]  naproxen sodium (ALEVE) 220 MG tablet Take 220 mg by mouth daily as needed.    [provider]      Allergies    Influenza a (h1n1) monoval pf, Penicillamine, Penicillin g, Other, and Penicillins    Review of Systems   Review of Systems  Skin:  Positive for color change.       Erythema and swelling to the right knee, laceration with sutures in place.    Physical Exam Updated Vital Signs BP (!) 166/89 (BP Location: Left Arm)   Pulse 81   Temp 98.4 F (36.9 C) (Oral)   Resp 16   SpO2 97%  Physical Exam Vitals and nursing note reviewed.  Constitutional:      Appearance: She is not ill-appearing or toxic-appearing.  HENT:     Head: Normocephalic and atraumatic.  Eyes:     General: No scleral icterus.       Right eye: No discharge.        Left eye: No discharge.  Conjunctiva/sclera: Conjunctivae normal.  Cardiovascular:     Rate and Rhythm: Normal rate and regular rhythm.  Pulmonary:     Effort: Pulmonary effort is normal.  Musculoskeletal:     Right lower leg: No edema.     Left lower leg: No edema.  Skin:    General: Skin is warm and dry.     Capillary Refill: Capillary refill takes less than 2 seconds.       Neurological:     General: No focal deficit present.     Mental Status: She is alert.  Psychiatric:        Mood and Affect: Mood normal.        ED Results / Procedures / Treatments   Labs (all labs ordered are listed, but only abnormal results are displayed) Labs Reviewed  CBC WITH DIFFERENTIAL/PLATELET - Abnormal; Notable for the following components:      Result Value   HCT 35.7 (*)    All other components within normal limits  BASIC  METABOLIC PANEL    EKG None  Radiology DG Knee 2 Views Right  Result Date: 09/05/2022 CLINICAL DATA:  Laceration with infection EXAM: RIGHT KNEE - 1-2 VIEW COMPARISON:  Right knee x-ray 08/28/2022 FINDINGS: There is anterior soft tissue swelling with laceration overlying the kneecap. Soft tissue swelling has increased from prior. 1 mm rounded density appears like a calcification in the region of the laceration, unchanged from prior. There are other similar appearing calcifications anterior to the tibia. There is no acute fracture or dislocation. Peripheral vascular calcifications are present. IMPRESSION: 1. Increased anterior soft tissue swelling with laceration overlying the knee. 2. No acute fracture or dislocation. 3. 1 mm rounded density in the region of the laceration, unchanged from prior. This is favored as soft tissue calcification rather than foreign body. Electronically Signed   By: Darliss Cheney M.D.   On: 09/05/2022 20:28    Procedures Procedures    Medications Ordered in ED Medications  0.9 %  sodium chloride infusion ( Intravenous New Bag/Given 09/05/22 2101)  ceFAZolin (ANCEF) IVPB 1 g/50 mL premix (1 g Intravenous New Bag/Given 09/05/22 2103)    ED Course/ Medical Decision Making/ A&P                             Medical Decision Making 89 year old female presents with concern for redness and swelling to the right lower extremity at site of laceration repaired 8 days ago.  Hypertensive on intake vital signs otherwise normal.  Cardiopulmonary exam unremarkable, abdominal exam is benign.  Patient with cellulitic changes of the right lower leg without purulent, knee is very hot to the touch, and erythematous and right lower extremity distal to the knee has significant soft tissue swelling.  Normal DP pulse on the right.  Concerning for possible DVT versus cellulitis.  Unable to obtain DVT study due to the hour in the emergency department.  Patient can return to ED tomorrow  for scheduled DVT study.   Amount and/or Complexity of Data Reviewed Labs: ordered.    Details: CBC without leukocytosis Radiology: ordered.  Risk Prescription drug management.   Patient evaluated the bedside by EDP Dr. Rubin Payor.  Given reassuring CBC, vital signs and patient which is well-appearing, do not feel admission to the hospital is warranted at this time.  Patient given a dose of IV antibiotics in the emergency department with, will transition patient from Keflex to doxycycline orally in the outpatient  setting with strict return precautions.  Clinical concern for emergent presentation that would warrant further ED workup or inpatient management is low.  Patient and her son Robin [name] at the bedside voiced understanding of the indications for return to the ED.  They voiced understanding of her medical evaluation and treatment plan. Each of their questions answered to their expressed satisfaction.  Return precautions were given.  Patient is well-appearing, stable, and was discharged in good condition.  This chart was dictated using voice recognition software, Dragon. Despite the best efforts of this provider to proofread and correct errors, errors may still occur which can change documentation meaning.  Final Clinical Impression(s) / ED Diagnoses Final diagnoses:  Cellulitis of right lower extremity    Rx / DC Orders ED Discharge Orders          Ordered    doxycycline (VIBRAMYCIN) 100 MG capsule  2 times daily        09/05/22 2206              Jaron Czarnecki, Idelia Salm 09/05/22 2221    Benjiman Core, MD 09/06/22 1450

## 2022-09-06 ENCOUNTER — Ambulatory Visit (HOSPITAL_BASED_OUTPATIENT_CLINIC_OR_DEPARTMENT_OTHER): Admission: RE | Admit: 2022-09-06 | Payer: Medicare Other | Source: Ambulatory Visit

## 2022-09-06 ENCOUNTER — Other Ambulatory Visit (HOSPITAL_BASED_OUTPATIENT_CLINIC_OR_DEPARTMENT_OTHER): Payer: Self-pay | Admitting: Emergency Medicine

## 2022-09-06 ENCOUNTER — Ambulatory Visit (HOSPITAL_BASED_OUTPATIENT_CLINIC_OR_DEPARTMENT_OTHER)
Admission: RE | Admit: 2022-09-06 | Discharge: 2022-09-06 | Disposition: A | Discharge status: Home / Self Care | Payer: Medicare Other | Source: Ambulatory Visit | Attending: Emergency Medicine | Admitting: Emergency Medicine

## 2022-09-06 ENCOUNTER — Ambulatory Visit (HOSPITAL_BASED_OUTPATIENT_CLINIC_OR_DEPARTMENT_OTHER): Payer: Medicare Other

## 2022-09-06 DIAGNOSIS — M79604 Pain in right leg: Secondary | ICD-10-CM | POA: Diagnosis not present

## 2022-09-06 NOTE — ED Provider Notes (Signed)
Patient returns for venous Doppler study of her right lower extremity.  Radiology tech notified that the study is negative for DVT.  I did discuss this finding with patient and with family member.  Evidence of Baker's cyst were noted.  I encourage keeping leg elevated continue with antibiotic and I gave strict return precaution if she develop any symptoms concerning for worsening infection.  BP (!) 166/89 (BP Location: Left Arm)   Pulse 81   Temp 98.4 F (36.9 C) (Oral)   Resp 16   SpO2 97%   Results for orders placed or performed during the hospital encounter of 09/05/22  CBC with Differential  Result Value Ref Range   WBC 6.2 4.0 - 10.5 K/uL   RBC 3.95 3.87 - 5.11 MIL/uL   Hemoglobin 12.0 12.0 - 15.0 g/dL   HCT 16.1 (L) 09.6 - 04.5 %   MCV 90.4 80.0 - 100.0 fL   MCH 30.4 26.0 - 34.0 pg   MCHC 33.6 30.0 - 36.0 g/dL   RDW 40.9 81.1 - 91.4 %   Platelets 290 150 - 400 K/uL   nRBC 0.0 0.0 - 0.2 %   Neutrophils Relative % 62 %   Neutro Abs 3.9 1.7 - 7.7 K/uL   Lymphocytes Relative 26 %   Lymphs Abs 1.6 0.7 - 4.0 K/uL   Monocytes Relative 9 %   Monocytes Absolute 0.5 0.1 - 1.0 K/uL   Eosinophils Relative 3 %   Eosinophils Absolute 0.2 0.0 - 0.5 K/uL   Basophils Relative 0 %   Basophils Absolute 0.0 0.0 - 0.1 K/uL   Immature Granulocytes 0 %   Abs Immature Granulocytes 0.02 0.00 - 0.07 K/uL  Basic metabolic panel  Result Value Ref Range   Sodium 141 135 - 145 mmol/L   Potassium 4.2 3.5 - 5.1 mmol/L   Chloride 103 98 - 111 mmol/L   CO2 28 22 - 32 mmol/L   Glucose, Bld 95 70 - 99 mg/dL   BUN 10 8 - 23 mg/dL   Creatinine, Ser 7.82 0.44 - 1.00 mg/dL   Calcium 9.5 8.9 - 95.6 mg/dL   GFR, Estimated >21 >30 mL/min   Anion gap 10 5 - 15   US Venous Img Lower Unilateral Right (DVT)  Result Date: 09/06/2022 CLINICAL DATA:  Right leg pain. Right knee redness and swelling and right ankle swelling. EXAM: Right LOWER EXTREMITY VENOUS DOPPLER ULTRASOUND TECHNIQUE: Gray-scale sonography  with graded compression, as well as color Doppler and duplex ultrasound were performed to evaluate the lower extremity deep venous systems from the level of the common femoral vein and including the common femoral, femoral, profunda femoral, popliteal and calf veins including the posterior tibial, peroneal and gastrocnemius veins when visible. The superficial great saphenous vein was also interrogated. Spectral Doppler was utilized to evaluate flow at rest and with distal augmentation maneuvers in the common femoral, femoral and popliteal veins. COMPARISON:  None Available. FINDINGS: Contralateral Common Femoral Vein: Respiratory phasicity is normal and symmetric with the symptomatic side. No evidence of thrombus. Normal compressibility. Common Femoral Vein: No evidence of thrombus. Normal compressibility, respiratory phasicity and response to augmentation. Saphenofemoral Junction: No evidence of thrombus. Normal compressibility and flow on color Doppler imaging. Profunda Femoral Vein: No evidence of thrombus. Normal compressibility and flow on color Doppler imaging. Femoral Vein: No evidence of thrombus. Normal compressibility, respiratory phasicity and response to augmentation. Popliteal Vein: No evidence of thrombus. Normal compressibility, respiratory phasicity and response to augmentation. Calf Veins: Diminished exam  detail of the posterior tibial and peroneal veins due to edema. Superficial Great Saphenous Vein: No evidence of thrombus. Normal compressibility. Venous Reflux:  None. Other Findings: Hypoechoic fluid collection within the popliteal fossa of the right lower extremity measures 3 x 0.6 x 1.7 cm. IMPRESSION: 1. No evidence of deep venous thrombosis. 2. Diminished exam detail of the posterior tibial and peroneal veins due to edema. 3. Small right popliteal fossa Baker's cyst. Electronically Signed   By: Signa Kell M.D.   On: 09/06/2022 15:46   DG Knee 2 Views Right  Result Date:  09/05/2022 CLINICAL DATA:  Laceration with infection EXAM: RIGHT KNEE - 1-2 VIEW COMPARISON:  Right knee x-ray 08/28/2022 FINDINGS: There is anterior soft tissue swelling with laceration overlying the kneecap. Soft tissue swelling has increased from prior. 1 mm rounded density appears like a calcification in the region of the laceration, unchanged from prior. There are other similar appearing calcifications anterior to the tibia. There is no acute fracture or dislocation. Peripheral vascular calcifications are present. IMPRESSION: 1. Increased anterior soft tissue swelling with laceration overlying the knee. 2. No acute fracture or dislocation. 3. 1 mm rounded density in the region of the laceration, unchanged from prior. This is favored as soft tissue calcification rather than foreign body. Electronically Signed   By: Darliss Cheney M.D.   On: 09/05/2022 20:28   DG Knee Complete 4 Views Right  Result Date: 08/28/2022 CLINICAL DATA:  Patient fell landing on the right knee. Skin separation with exposure of the right knee joint. EXAM: RIGHT KNEE - COMPLETE 4+ VIEW COMPARISON:  None Available. FINDINGS: Degenerative changes in the right knee with medial compartment narrowing and small osteophyte formation. No acute fracture or dislocation is identified. Visualization of the patella and suprapatellar region is limited due to patient positioning but no obvious right knee effusion is identified. Soft tissue defect laterally over the patella appears to extend to the bone surface. Vascular calcifications in the soft tissues. No radiopaque soft tissue foreign bodies. IMPRESSION: 1. Soft tissue laceration identified anteriorly appears to extend to the patellar surface. 2. No acute bony abnormalities. 3. Mild degenerative changes in the medial compartment. Electronically Signed   By: Burman Nieves M.D.   On: 08/28/2022 17:30      Fayrene Helper, PA-C 09/06/22 1603    Horton, Clabe Seal, DO 09/06/22 2333

## 2022-09-06 NOTE — ED Notes (Signed)
Patient seen in ED yesterday and told to come in today for outpatient ultrasound of Rt leg (venous). Order was put in incorrectly so it automatically deleted after patient was discharged from ED. I spoke with Dr. Freida Busman regarding situation and he verbalized agreement for imaging to put in a new order for Rt leg venous doppler for him to e-sign as he will be giving patient the exam findings today.

## 2022-09-17 ENCOUNTER — Encounter (HOSPITAL_COMMUNITY): Payer: Self-pay | Admitting: *Deleted

## 2022-09-17 ENCOUNTER — Emergency Department (HOSPITAL_COMMUNITY)
Admission: EM | Admit: 2022-09-17 | Discharge: 2022-09-17 | Disposition: A | Discharge status: Home / Self Care | Payer: Medicare Other | Attending: Emergency Medicine | Admitting: Emergency Medicine

## 2022-09-17 ENCOUNTER — Other Ambulatory Visit: Payer: Self-pay

## 2022-09-17 DIAGNOSIS — Z4800 Encounter for change or removal of nonsurgical wound dressing: Secondary | ICD-10-CM | POA: Diagnosis not present

## 2022-09-17 DIAGNOSIS — Z7982 Long term (current) use of aspirin: Secondary | ICD-10-CM | POA: Diagnosis not present

## 2022-09-17 DIAGNOSIS — R197 Diarrhea, unspecified: Secondary | ICD-10-CM | POA: Diagnosis present

## 2022-09-17 DIAGNOSIS — Z5189 Encounter for other specified aftercare: Secondary | ICD-10-CM

## 2022-09-17 LAB — CBC WITH DIFFERENTIAL/PLATELET
Abs Immature Granulocytes: 0.04 10*3/uL (ref 0.00–0.07)
Basophils Absolute: 0 10*3/uL (ref 0.0–0.1)
Basophils Relative: 0 %
Eosinophils Absolute: 0.1 10*3/uL (ref 0.0–0.5)
Eosinophils Relative: 2 %
HCT: 35.3 % — ABNORMAL LOW (ref 36.0–46.0)
Hemoglobin: 11.4 g/dL — ABNORMAL LOW (ref 12.0–15.0)
Immature Granulocytes: 1 %
Lymphocytes Relative: 15 %
Lymphs Abs: 1.3 10*3/uL (ref 0.7–4.0)
MCH: 29.4 pg (ref 26.0–34.0)
MCHC: 32.3 g/dL (ref 30.0–36.0)
MCV: 91 fL (ref 80.0–100.0)
Monocytes Absolute: 0.7 10*3/uL (ref 0.1–1.0)
Monocytes Relative: 8 %
Neutro Abs: 6.3 10*3/uL (ref 1.7–7.7)
Neutrophils Relative %: 74 %
Platelets: 254 10*3/uL (ref 150–400)
RBC: 3.88 MIL/uL (ref 3.87–5.11)
RDW: 13.3 % (ref 11.5–15.5)
WBC: 8.5 10*3/uL (ref 4.0–10.5)
nRBC: 0 % (ref 0.0–0.2)

## 2022-09-17 LAB — COMPREHENSIVE METABOLIC PANEL
ALT: 11 U/L (ref 0–44)
AST: 18 U/L (ref 15–41)
Albumin: 3.6 g/dL (ref 3.5–5.0)
Alkaline Phosphatase: 108 U/L (ref 38–126)
Anion gap: 9 (ref 5–15)
BUN: 19 mg/dL (ref 8–23)
CO2: 25 mmol/L (ref 22–32)
Calcium: 9.1 mg/dL (ref 8.9–10.3)
Chloride: 104 mmol/L (ref 98–111)
Creatinine, Ser: 0.79 mg/dL (ref 0.44–1.00)
GFR, Estimated: >60 mL/min (ref >60)
Glucose, Bld: 91 mg/dL (ref 70–99)
Potassium: 4.2 mmol/L (ref 3.5–5.1)
Sodium: 138 mmol/L (ref 135–145)
Total Bilirubin: 0.5 mg/dL (ref 0.3–1.2)
Total Protein: 6.8 g/dL (ref 6.5–8.1)

## 2022-09-17 LAB — MAGNESIUM: Magnesium: 2.2 mg/dL (ref 1.7–2.4)

## 2022-09-17 MED ORDER — SODIUM CHLORIDE 0.9 % IV BOLUS
1000.0000 mL | Freq: Once | INTRAVENOUS | Status: AC
  Administered 2022-09-17: 1000 mL via INTRAVENOUS

## 2022-09-17 NOTE — ED Triage Notes (Signed)
Knee laceration that is not haling well, has been and antibiotics, now has diarrhea, went ot UC today and sent here for re evaluation.

## 2022-09-17 NOTE — ED Provider Notes (Signed)
[place] EMERGENCY DEPARTMENT AT [facility] Provider Note   CSN: [id number] Arrival date & time: 09/17/22  1007     History  Chief Complaint  Patient presents with   Diarrhea   Knee Injury    Robin [name] is a 89 y.o. female present emergency department complaint of diarrhea.  Patient has been manage for a right knee wound for approximately 3 weeks and has been on extensive courses of antibiotics.  She was treated with doxycycline originally.  Subsequently when she went to urgent care she was treated with clindamycin as well as cephalexin.  She has had improvement of the redness involving the laceration and wound over her right knee, but has started having loose bowel movements the past several days, poor appetite, feeling queasy.  She is here with her children at the bedside.  They report they were to urgent care today and the patient had her stitches removed from her right knee wound, which been in place for about 2 weeks, but there was concern that the wound may still be dehisced.  She was sent to the ER for further evaluation  She has had negative DVT ultrasound performed last month for right leg swelling  HPI     Home Medications Prior to Admission medications   Medication Sig Start Date End Date Taking? Authorizing Provider  acetaminophen (TYLENOL 8 HOUR ARTHRITIS PAIN) 650 MG CR tablet Take 650 mg by mouth every 8 (eight) hours as needed for pain.    [provider]  aspirin EC 81 MG EC tablet Take 1 tablet (81 mg total) by mouth daily. 03/22/12   Lyn Records, MD  cephALEXin (KEFLEX) 500 MG capsule 2 caps po bid x 7 days 08/28/22   Melene Plan, DO  CRANBERRY-VITAMIN C PO Take 2 tablets by mouth 2 (two) times daily. 4200mg  +Vitamin C    [provider]  diclofenac sodium (VOLTAREN) 1 % GEL Apply 2 g topically 4 (four) times daily as needed.    [provider]  doxycycline (VIBRAMYCIN) 100 MG capsule Take 1 capsule (100 mg  total) by mouth 2 (two) times daily. 09/05/22   Sponseller, Lupe Carney R, PA-C  Echinacea 400 MG CAPS Take 400 mg by mouth 2 (two) times daily.    [provider]  ibuprofen (ADVIL,MOTRIN) 200 MG tablet Take 200 mg by mouth every 6 (six) hours as needed for headache, mild pain or moderate pain.    [provider]  naproxen sodium (ALEVE) 220 MG tablet Take 220 mg by mouth daily as needed.    [provider]      Allergies    Influenza a (h1n1) monoval pf, Penicillamine, Penicillin g, Other, and Penicillins    Review of Systems   Review of Systems  Physical Exam Updated Vital Signs BP (!) 168/87 (BP Location: Left Arm)   Pulse 79   Temp 97.9 F (36.6 C)   Resp 16   Ht 4\' 9"  (1.448 m)   Wt 54.4 kg   SpO2 97%   BMI 25.97 kg/m  Physical Exam Constitutional:      General: She is not in acute distress. HENT:     Head: Normocephalic and atraumatic.  Eyes:     Conjunctiva/sclera: Conjunctivae normal.     Pupils: Pupils are equal, round, and reactive to light.  Cardiovascular:     Rate and Rhythm: Normal rate and regular rhythm.  Pulmonary:     Effort: Pulmonary effort is normal. No respiratory  distress.  Abdominal:     General: There is no distension.     Tenderness: There is no abdominal tenderness.  Skin:    General: Skin is warm and dry.     Comments: Large curvilinear laceration site overlying the right suprapatellar region, with some wound dehiscence, no active drainage.  There is some mild erythema of the right lower extremity but improved compared to images visualized at bedside  Neurological:     General: No focal deficit present.     Mental Status: She is alert. Mental status is at baseline.  Psychiatric:        Mood and Affect: Mood normal.        Behavior: Behavior normal.     ED Results / Procedures / Treatments   Labs (all labs ordered are listed, but only abnormal results are displayed) Labs Reviewed  CBC WITH DIFFERENTIAL/PLATELET -  Abnormal; Notable for the following components:      Result Value   Hemoglobin 11.4 (*)    HCT 35.3 (*)    All other components within normal limits  C DIFFICILE QUICK SCREEN W PCR REFLEX    COMPREHENSIVE METABOLIC PANEL  MAGNESIUM    EKG None  Radiology No results found.  Procedures Procedures    Medications Ordered in ED Medications  sodium chloride 0.9 % bolus 1,000 mL (0 mLs Intravenous Stopped 09/17/22 1224)    ED Course/ Medical Decision Making/ A&P                                 Medical Decision Making Amount and/or Complexity of Data Reviewed Labs: ordered.   This patient presents to the ED with concern for diarrhea, concern for wound dehiscence. This involves an extensive number of treatment options, and is a complaint that carries with it a high risk of complications and morbidity.    Patient has been on multiple rounds of antibiotics replacement fairly high risk for C. difficile opportunistic infection.  I have ordered stool studies as well as checking her electrolytes and her white blood cell count.  She does not show signs of sepsis.  Overall it appears that the erythema is dramatically improved involving her right lower extremity I do not see evidence of persistent infection.  At this point I suspect she is suffering from poor wound healing, and there is some unfortunate dehiscence of this wound site; she may require steri-strip adhesives to approximate the edges, but I don't think sutures are indicated now 3 weeks out from the injury, with visible granulation tissue healing within the wound.  Doubt septic joint, good ROM of the right knee.  Co-morbidities that complicate the patient evaluation: age, antibiotic usage  Additional history obtained from children at bedside  I ordered and personally interpreted labs.  The pertinent results include: No emergent findings  I ordered medication including IV fluid bolus for hydration  I have reviewed the patients  home medicines and have made adjustments as needed  After the interventions noted above, I reevaluated the patient and found that they have: stayed the same   Unfortunately after 6 hours the patient has not been able to provide a stool sample.  It is not clear to me whether her diarrhea is simply a side effect of her antibiotics, or whether it may in fact be a mild case of C. difficile (no fever, leukocytosis, or blood diarrhea to suggest moderate/severe infection).  Unfortunately she does  not have a primary care provider's office, and a referral to gastroenterology is likely to take several months.  Therefore I discussed with the charge nurse Felipa Furnace and the patient's children the option of returning with a stool sample to the ED. if she test positive she would need oral vancomycin.  In the meantime we will discontinue the antibiotics.  This point she has completed 2 full weeks of antibiotics, with a week of doxycycline, week of clindamycin and Keflex.  I think further antibiotics at this point are more likely to raise her risk further of C. difficile.  Her infection appears to have dramatically improved.  We can dress the wound locally with Xeroform for the next several days and place referral to wound care.  I do not see an indication for hospitalization at this time.  Low suspicion for sepsis.  Dispostion:  After consideration of the diagnostic results and the patients response to treatment, I feel that the patent would benefit from outpatient follow-up.  An ambulatory referral was placed to the wound clinic         Final Clinical Impression(s) / ED Diagnoses Final diagnoses:  Visit for wound check  Diarrhea, unspecified type    Rx / DC Orders ED Discharge Orders          Ordered    AMB referral to wound care center       Comments: Persistent right knee wound, slow healing   09/17/22 1519              Terald Sleeper, MD 09/17/22 1707

## 2022-09-17 NOTE — Discharge Instructions (Addendum)
I recommend that you leave the dressings in place for 3 days.  After that you can take down the dressings, take another photo and reexamine the wound, and if it is still gaping you can apply the Xeroform for another 3 days and redress it.  This wound may take a few more weeks to completely heal.  I recommend follow up at the wound clinic.  We decided stop her antibiotics now out of concern for diarrhea, as these antibiotics can lead to C. difficile infection.  This can be a serious cause of diarrhea.  C. difficile is tested for stool sample.
